# Patient Record
Sex: Female | Born: 2000 | Race: Black or African American | Hispanic: No | Marital: Single | Smoking: Never smoker
Health system: Southern US, Community
[De-identification: ages and names within clinical notes are randomized; demographics above are authoritative.]

## PROBLEM LIST (undated history)

## (undated) ENCOUNTER — Inpatient Hospital Stay (HOSPITAL_COMMUNITY): Payer: Self-pay

## (undated) ENCOUNTER — Ambulatory Visit

## (undated) ENCOUNTER — Ambulatory Visit: Payer: Self-pay | Source: Home / Self Care

## (undated) DIAGNOSIS — B999 Unspecified infectious disease: Secondary | ICD-10-CM

## (undated) DIAGNOSIS — N390 Urinary tract infection, site not specified: Secondary | ICD-10-CM

## (undated) DIAGNOSIS — Z789 Other specified health status: Secondary | ICD-10-CM

## (undated) HISTORY — PX: NO PAST SURGERIES: SHX2092

---
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
## 2018-01-04 ENCOUNTER — Encounter: Payer: Self-pay | Admitting: *Deleted

## 2018-01-04 NOTE — Telephone Encounter (Signed)
Reply via MyChart message.

## 2018-01-27 ENCOUNTER — Ambulatory Visit: Payer: Medicaid Other | Admitting: Internal Medicine

## 2018-01-28 ENCOUNTER — Encounter: Payer: Self-pay | Admitting: Internal Medicine

## 2018-01-28 ENCOUNTER — Other Ambulatory Visit (HOSPITAL_COMMUNITY)
Admission: RE | Admit: 2018-01-28 | Discharge: 2018-01-28 | Disposition: A | Discharge status: Home / Self Care | Payer: Medicaid Other | Source: Ambulatory Visit | Attending: Family Medicine | Admitting: Family Medicine

## 2018-01-28 ENCOUNTER — Ambulatory Visit (INDEPENDENT_AMBULATORY_CARE_PROVIDER_SITE_OTHER): Payer: Medicaid Other | Admitting: Internal Medicine

## 2018-01-28 VITALS — BP 124/60 | HR 98 | Temp 99.0°F | Wt 146.0 lb

## 2018-01-28 DIAGNOSIS — N926 Irregular menstruation, unspecified: Secondary | ICD-10-CM

## 2018-01-28 DIAGNOSIS — Z113 Encounter for screening for infections with a predominantly sexual mode of transmission: Secondary | ICD-10-CM | POA: Diagnosis not present

## 2018-01-28 DIAGNOSIS — Z3A01 Less than 8 weeks gestation of pregnancy: Secondary | ICD-10-CM

## 2018-01-28 DIAGNOSIS — Z3201 Encounter for pregnancy test, result positive: Secondary | ICD-10-CM | POA: Diagnosis not present

## 2018-01-28 DIAGNOSIS — Z3400 Encounter for supervision of normal first pregnancy, unspecified trimester: Secondary | ICD-10-CM | POA: Insufficient documentation

## 2018-01-28 DIAGNOSIS — Z3401 Encounter for supervision of normal first pregnancy, first trimester: Secondary | ICD-10-CM

## 2018-01-28 LAB — POCT URINE PREGNANCY: PREG TEST UR: POSITIVE — AB

## 2018-01-28 MED ORDER — PRENATAL 27-0.8 MG PO TABS: 1.0000 | ORAL_TABLET | Freq: Every day | ORAL | 0 refills | Status: DC

## 2018-01-28 NOTE — Patient Instructions (Addendum)
It was so nice to see you!  I have sent in a referral to [facility].  I have also sent in a prenatal vitamin to your pharmacy. Please take 1 vitamin daily.  We have scheduled an initial ultrasound for you.  -Dr. Nancy Marus

## 2018-01-28 NOTE — Progress Notes (Signed)
   [facility] Phone: [phone]  Subjective:  Sheri Nunez is an 13 yeah old female presenting to clinic with two positive pregnancy tests at home. Her LMP was April 6th. Her next period was supposed to be on May 6th, as she is always very regular. She had some light spotting yesterday that self-resolved. She denies any abdominal pain or cramping. She would like a referral to [facility] for prenatal care.  ROS: See HPI for pertinent positives and negatives  Past Medical History- eczema  Family history reviewed for today's visit. No changes.  Social history- patient is a never smoker  Objective: BP 124/60   Pulse 98   Temp 99 F (37.2 C) (Oral)   Wt 146 lb (66.2 kg)   LMP 12/18/2017 (Exact Date)   SpO2 97%   BMI 22.20 kg/m  Gen: NAD, alert, cooperative with exam HEENT: NCAT, EOMI, MMM Resp: Normal work of breathing Msk: No edema  Assessment/Plan: Pregnancy: Patient had two positive pregnancy tests at home. Pregnancy test positive in clinic today. Patient also requesting gonorrhea/chlamydia testing. Will add this to urine sample. Not taking any medications. Started daily prenatal vitamin. Will obtain OB US due to patient having light spotting yesterday. Denies any abdominal pain. Referred to [facility] for prenatal care per patient request.   Willadean Carol, MD PGY-3

## 2018-01-28 NOTE — Assessment & Plan Note (Addendum)
Patient had two positive pregnancy tests at home. Pregnancy test positive in clinic today. Patient also requesting gonorrhea/chlamydia testing. Will add this to urine sample. Not taking any medications. Started daily prenatal vitamin. Will obtain OB US due to patient having light spotting yesterday. Denies any abdominal pain. Referred to [facility] for prenatal care per patient request.

## 2018-01-31 LAB — URINE CYTOLOGY ANCILLARY ONLY
CHLAMYDIA, DNA PROBE: NEGATIVE
NEISSERIA GONORRHEA: NEGATIVE
Trichomonas: NEGATIVE

## 2018-02-08 ENCOUNTER — Ambulatory Visit (HOSPITAL_COMMUNITY)
Admission: RE | Admit: 2018-02-08 | Discharge: 2018-02-08 | Disposition: A | Discharge status: Home / Self Care | Payer: Medicaid Other | Source: Ambulatory Visit | Attending: Family Medicine | Admitting: Family Medicine

## 2018-02-08 DIAGNOSIS — Z3401 Encounter for supervision of normal first pregnancy, first trimester: Secondary | ICD-10-CM | POA: Insufficient documentation

## 2018-02-21 ENCOUNTER — Encounter (HOSPITAL_COMMUNITY): Payer: Self-pay | Admitting: *Deleted

## 2018-02-21 ENCOUNTER — Encounter: Payer: Medicaid Other | Admitting: Family Medicine

## 2018-02-21 ENCOUNTER — Inpatient Hospital Stay (HOSPITAL_COMMUNITY)
Admission: AD | Admit: 2018-02-21 | Discharge: 2018-02-21 | Disposition: A | Discharge status: Home / Self Care | Payer: Medicaid Other | Source: Ambulatory Visit | Attending: Obstetrics and Gynecology | Admitting: Obstetrics and Gynecology

## 2018-02-21 DIAGNOSIS — Z3A09 9 weeks gestation of pregnancy: Secondary | ICD-10-CM | POA: Diagnosis not present

## 2018-02-21 DIAGNOSIS — O209 Hemorrhage in early pregnancy, unspecified: Secondary | ICD-10-CM | POA: Diagnosis present

## 2018-02-21 LAB — WET PREP, GENITAL
Clue Cells Wet Prep HPF POC: NONE SEEN
Sperm: NONE SEEN
Trich, Wet Prep: NONE SEEN
YEAST WET PREP: NONE SEEN

## 2018-02-21 MED ORDER — DOXYLAMINE-PYRIDOXINE 10-10 MG PO TBEC: 2.0000 | DELAYED_RELEASE_TABLET | Freq: Every day | ORAL | 1 refills | Status: DC

## 2018-02-21 NOTE — MAU Note (Signed)
Pt reports blood on the tissue when she wiped this am, no pain.

## 2018-02-21 NOTE — Discharge Instructions (Signed)

## 2018-02-21 NOTE — MAU Provider Note (Signed)
History     CSN: [id number]  Arrival date and time: 02/21/18 1045   First Provider Initiated Contact with Patient 02/21/18 1130      Chief Complaint  Patient presents with  . Vaginal Bleeding   Sheri Nunez is a 17 y.o. G1P0 at 1846w2d who presents today with vaginal bleeding. She has an US with confirmed IUP on 02/08/18.  Vaginal Bleeding  The patient's primary symptoms include vaginal bleeding. The patient's pertinent negatives include no pelvic pain or vaginal discharge. This is a new problem. The current episode started today. The problem occurs intermittently. The problem has been resolved. The patient is experiencing no pain. She is pregnant. Associated symptoms include nausea and vomiting. Pertinent negatives include no chills, dysuria, fever or frequency. The vaginal discharge was bloody. The vaginal bleeding is lighter than menses. She has been passing clots (some about the size of a pea, and some about the size of a grape.). She has not been passing tissue. Exacerbated by: Last intercourse 02/18/18. She has tried nothing for the symptoms. She is sexually active.    History reviewed. No pertinent past medical history.  History reviewed. No pertinent surgical history.  History reviewed. No pertinent family history.  Social History   Tobacco Use  . Smoking status: Never Smoker  . Smokeless tobacco: Never Used  Substance Use Topics  . Alcohol use: No  . Drug use: No    Allergies: No Known Allergies  Medications Prior to Admission  Medication Sig Dispense Refill Last Dose  . hydrocortisone 2.5 % cream Apply topically 2 (two) times daily. 30 g 1 Taking  . Prenatal Vit-Fe Fumarate-FA (MULTIVITAMIN-PRENATAL) 27-0.8 MG TABS tablet Take 1 tablet by mouth daily at 12 noon. 180 each 0     Review of Systems  Constitutional: Negative for chills and fever.  Gastrointestinal: Positive for nausea and vomiting.  Genitourinary: Positive for vaginal bleeding. Negative for dysuria,  frequency, pelvic pain and vaginal discharge.   Physical Exam   Blood pressure 108/82, pulse 90, temperature 98.7 F (37.1 C), temperature source Oral, resp. rate 18, height 5\' 8"  (1.727 m), weight 145 lb (65.8 kg), last menstrual period 12/18/2017, SpO2 100 %.  Physical Exam  Nursing note and vitals reviewed. Constitutional: She is oriented to person, place, and time. She appears well-developed and well-nourished. No distress.  HENT:  Head: Normocephalic.  Cardiovascular: Normal rate.  Respiratory: Effort normal.  GI: Soft. There is no tenderness. There is no rebound.  Neurological: She is alert and oriented to person, place, and time.  Skin: Skin is warm and dry.  Psychiatric: She has a normal mood and affect.   Pt informed that the ultrasound is considered a limited OB ultrasound and is not intended to be a complete ultrasound exam.  Patient also informed that the ultrasound is not being completed with the intent of assessing for fetal or placental anomalies or any pelvic abnormalities.  Explained that the purpose of today's ultrasound is to assess for  viability.  Patient acknowledges the purpose of the exam and the limitations of the study.      MAU Course  Procedures  MDM   Assessment and Plan   1. Vaginal bleeding in pregnancy, first trimester   2. [redacted] weeks gestation of pregnancy    DC home Comfort measures reviewed  1st Trimester precautions  Bleeding precautions RX: none  Return to MAU as needed FU with OB as planned  Follow-up Information    [facility]  Follow up.   Specialty:  Family Medicine Contact information: [address] [id number] [place] [address] [phone]          Thressa Sheller 02/21/2018, 11:30 AM

## 2018-02-22 LAB — GC/CHLAMYDIA PROBE AMP (~~LOC~~) NOT AT ARMC
CHLAMYDIA, DNA PROBE: NEGATIVE
Neisseria Gonorrhea: NEGATIVE

## 2018-02-23 ENCOUNTER — Encounter (HOSPITAL_COMMUNITY): Payer: Self-pay

## 2018-02-23 ENCOUNTER — Other Ambulatory Visit: Payer: Self-pay

## 2018-02-23 ENCOUNTER — Inpatient Hospital Stay (HOSPITAL_COMMUNITY)
Admission: AD | Admit: 2018-02-23 | Discharge: 2018-02-23 | Disposition: A | Discharge status: Home / Self Care | Payer: Medicaid Other | Source: Ambulatory Visit | Attending: Obstetrics and Gynecology | Admitting: Obstetrics and Gynecology

## 2018-02-23 DIAGNOSIS — O9989 Other specified diseases and conditions complicating pregnancy, childbirth and the puerperium: Secondary | ICD-10-CM | POA: Diagnosis not present

## 2018-02-23 DIAGNOSIS — Z3A09 9 weeks gestation of pregnancy: Secondary | ICD-10-CM | POA: Diagnosis not present

## 2018-02-23 DIAGNOSIS — O98811 Other maternal infectious and parasitic diseases complicating pregnancy, first trimester: Secondary | ICD-10-CM | POA: Diagnosis not present

## 2018-02-23 DIAGNOSIS — B373 Candidiasis of vulva and vagina: Secondary | ICD-10-CM

## 2018-02-23 DIAGNOSIS — N898 Other specified noninflammatory disorders of vagina: Secondary | ICD-10-CM | POA: Diagnosis present

## 2018-02-23 DIAGNOSIS — B3731 Acute candidiasis of vulva and vagina: Secondary | ICD-10-CM

## 2018-02-23 HISTORY — DX: Other specified health status: Z78.9

## 2018-02-23 LAB — URINALYSIS, ROUTINE W REFLEX MICROSCOPIC
Bilirubin Urine: NEGATIVE
Glucose, UA: NEGATIVE mg/dL
KETONES UR: NEGATIVE mg/dL
Nitrite: NEGATIVE
PH: 6 (ref 5.0–8.0)
Protein, ur: 30 mg/dL — AB
Specific Gravity, Urine: 1.027 (ref 1.005–1.030)

## 2018-02-23 LAB — WET PREP, GENITAL
Clue Cells Wet Prep HPF POC: NONE SEEN
Sperm: NONE SEEN
TRICH WET PREP: NONE SEEN
YEAST WET PREP: NONE SEEN

## 2018-02-23 MED ORDER — TERCONAZOLE 0.8 % VA CREA: 1.0000 | TOPICAL_CREAM | Freq: Every day | VAGINAL | 0 refills | Status: DC

## 2018-02-23 NOTE — MAU Note (Signed)
Urine in the lab  

## 2018-02-23 NOTE — Discharge Instructions (Signed)

## 2018-02-23 NOTE — MAU Provider Note (Signed)
History     CSN: [id number]  Arrival date and time: 02/23/18 [id number]   First Provider Initiated Contact with Patient 02/23/18 1032      Chief Complaint  Patient presents with  . Vaginal Discharge  . burning with urination   HPI  Sheri Nunez is a 17 y.o. G1P0 at 7636w4d who presents with vaginal itching and irritation. Was seen in MAU 2 days ago for vaginal bleeding but did not have these symptoms then. Reports change in discharge yesterday and irritation today. Describes thick white discharge. No foul odor. Feels irritated in vaginal area. Also reports burning when she voids. Feels like burning comes from her urethra and when her urine touches her vulva. Denies vaginal bleeding or hematuria. No intercourse since last visit to MAU. GC/CT was negative at that visit.   OB History    Gravida  1   Para      Term      Preterm      AB      Living        SAB      TAB      Ectopic      Multiple      Live Births              Past Medical History:  Diagnosis Date  . Medical history non-contributory     Past Surgical History:  Procedure Laterality Date  . NO PAST SURGERIES      History reviewed. No pertinent family history.  Social History   Tobacco Use  . Smoking status: Never Smoker  . Smokeless tobacco: Never Used  Substance Use Topics  . Alcohol use: No  . Drug use: No    Allergies: No Known Allergies  Medications Prior to Admission  Medication Sig Dispense Refill Last Dose  . hydrocortisone cream 1 % Apply 1 application topically daily as needed (For breakouts.).   Past Month at Unknown time  . Prenatal Vit-Fe Fumarate-FA (MULTIVITAMIN-PRENATAL) 27-0.8 MG TABS tablet Take 1 tablet by mouth daily at 12 noon. 180 each 0 02/22/2018 at Unknown time  . Doxylamine-Pyridoxine 10-10 MG TBEC Take 2 tablets by mouth at bedtime. Take 2 tabs at bedtime, may use 1 tab in am, and 1 tab in afternoon (Patient taking differently: Take 1-2 tablets by mouth 3 (three)  times daily as needed (For nausea.). Take 2 tabs at bedtime, may use 1 tab in am, and 1 tab in afternoon) 100 tablet 1 Has not started    Review of Systems  Constitutional: Negative.   Gastrointestinal: Negative.   Genitourinary: Positive for dysuria and vaginal discharge. Negative for frequency, genital sores, hematuria and vaginal bleeding.   Physical Exam   Blood pressure 129/76, pulse 91, temperature 98.7 F (37.1 C), temperature source Oral, resp. rate 16, weight 146 lb 4 oz (66.3 kg), last menstrual period 12/18/2017, SpO2 100 %.  Physical Exam  Nursing note and vitals reviewed. Constitutional: She is oriented to person, place, and time. She appears well-developed and well-nourished. No distress.  HENT:  Head: Normocephalic and atraumatic.  Eyes: Conjunctivae are normal. Right eye exhibits no discharge. Left eye exhibits no discharge. No scleral icterus.  Neck: Normal range of motion.  Respiratory: Effort normal. No respiratory distress.  Genitourinary: There is no lesion on the right labia. There is no lesion on the left labia. Cervix exhibits no discharge and no friability. No erythema or bleeding in the vagina. Vaginal discharge (minimal amount of thick white discharge adherent  to vaginal walls) found.  Neurological: She is alert and oriented to person, place, and time.  Skin: Skin is warm and dry. She is not diaphoretic.  Psychiatric: She has a normal mood and affect. Her behavior is normal. Judgment and thought content normal.    MAU Course  Procedures Results for orders placed or performed during the hospital encounter of 02/23/18 (from the past 24 hour(s))  Urinalysis, Routine w reflex microscopic     Status: Abnormal   Collection Time: 02/23/18  9:38 AM  Result Value Ref Range   Color, Urine YELLOW YELLOW   APPearance CLOUDY (A) CLEAR   Specific Gravity, Urine 1.027 1.005 - 1.030   pH 6.0 5.0 - 8.0   Glucose, UA NEGATIVE NEGATIVE mg/dL   Hgb urine dipstick SMALL  (A) NEGATIVE   Bilirubin Urine NEGATIVE NEGATIVE   Ketones, ur NEGATIVE NEGATIVE mg/dL   Protein, ur 30 (A) NEGATIVE mg/dL   Nitrite NEGATIVE NEGATIVE   Leukocytes, UA TRACE (A) NEGATIVE   RBC / HPF 0-5 0 - 5 RBC/hpf   WBC, UA 6-10 0 - 5 WBC/hpf   Bacteria, UA RARE (A) NONE SEEN   Squamous Epithelial / LPF 21-50 0 - 5   Mucus PRESENT   Wet prep, genital     Status: Abnormal   Collection Time: 02/23/18 10:46 AM  Result Value Ref Range   Yeast Wet Prep HPF POC NONE SEEN NONE SEEN   Trich, Wet Prep NONE SEEN NONE SEEN   Clue Cells Wet Prep HPF POC NONE SEEN NONE SEEN   WBC, Wet Prep HPF POC FEW (A) NONE SEEN   Sperm NONE SEEN     MDM U/a & wet prep collected U/a with some leuks and hemoglobin --- urine culture sent Wet prep negative --- will tx for yeast based on exam and complaint GC/CT not collected, negatiev 2 days ago Assessment and Plan  A:  1. Vaginal yeast infection    P: Discharge home Rx terazol 3 Urine culture pending Discussed reasons to return to MAU  Judeth Horn 02/23/2018, 10:32 AM

## 2018-02-23 NOTE — MAU Note (Signed)
Was here a couple days ago for bleeding.  Noted a white d/c yesterday.  Today she is having burning when she urinates.

## 2018-02-24 LAB — CULTURE, OB URINE: Culture: 60000 — AB

## 2018-02-25 ENCOUNTER — Other Ambulatory Visit: Payer: Self-pay | Admitting: Obstetrics and Gynecology

## 2018-02-25 DIAGNOSIS — O2341 Unspecified infection of urinary tract in pregnancy, first trimester: Secondary | ICD-10-CM

## 2018-02-25 MED ORDER — CEPHALEXIN 500 MG PO CAPS: 500.0000 mg | ORAL_CAPSULE | Freq: Four times a day (QID) | ORAL | 0 refills | Status: DC

## 2018-02-25 NOTE — Progress Notes (Signed)
TC to patient to notify of (+) UCx results and need to get Rx for UTI sent to pharmacy. Patient verbalized an understanding of the plan of care and agrees.   Raelyn MoraRolitta Waverly Tarquinio, CNM  02/25/2018 11:16 AM

## 2018-03-02 ENCOUNTER — Inpatient Hospital Stay (HOSPITAL_COMMUNITY)
Admission: AD | Admit: 2018-03-02 | Discharge: 2018-03-02 | Discharge status: Against Medical Advice | Payer: Medicaid Other | Source: Ambulatory Visit | Attending: Obstetrics & Gynecology | Admitting: Obstetrics & Gynecology

## 2018-03-02 ENCOUNTER — Other Ambulatory Visit: Payer: Self-pay

## 2018-03-02 DIAGNOSIS — Z5321 Procedure and treatment not carried out due to patient leaving prior to being seen by health care provider: Secondary | ICD-10-CM | POA: Diagnosis not present

## 2018-03-02 LAB — WET PREP, GENITAL
Clue Cells Wet Prep HPF POC: NONE SEEN
Sperm: NONE SEEN
Trich, Wet Prep: NONE SEEN
Yeast Wet Prep HPF POC: NONE SEEN

## 2018-03-02 NOTE — MAU Note (Signed)
Pt not in lobby.  

## 2018-03-02 NOTE — MAU Note (Signed)
Pt not in lobby x3. Pt left AMA.

## 2018-03-02 NOTE — MAU Note (Signed)
Was treated last week for yeast,  Still having thick d/c, vaginal irritation and one of her lips is swollen.

## 2018-03-02 NOTE — MAU Note (Signed)
Pt not in lobby x2 

## 2018-03-03 LAB — GC/CHLAMYDIA PROBE AMP (~~LOC~~) NOT AT ARMC
Chlamydia: NEGATIVE
Neisseria Gonorrhea: NEGATIVE

## 2018-03-08 ENCOUNTER — Other Ambulatory Visit (HOSPITAL_COMMUNITY)
Admission: RE | Admit: 2018-03-08 | Discharge: 2018-03-08 | Disposition: A | Discharge status: Home / Self Care | Payer: Medicaid Other | Source: Ambulatory Visit | Attending: Family Medicine | Admitting: Family Medicine

## 2018-03-08 ENCOUNTER — Encounter: Payer: Self-pay | Admitting: Family Medicine

## 2018-03-08 ENCOUNTER — Ambulatory Visit (INDEPENDENT_AMBULATORY_CARE_PROVIDER_SITE_OTHER): Payer: Medicaid Other | Admitting: Family Medicine

## 2018-03-08 ENCOUNTER — Other Ambulatory Visit: Payer: Self-pay

## 2018-03-08 VITALS — BP 100/70 | HR 95 | Temp 98.6°F | Wt 142.0 lb

## 2018-03-08 DIAGNOSIS — Z3401 Encounter for supervision of normal first pregnancy, first trimester: Secondary | ICD-10-CM | POA: Diagnosis present

## 2018-03-08 LAB — POCT WET PREP (WET MOUNT)
CLUE CELLS WET PREP WHIFF POC: NEGATIVE
TRICHOMONAS WET PREP HPF POC: ABSENT

## 2018-03-08 NOTE — Patient Instructions (Addendum)
It was great seeing you today! We'll see you back in 4 weeks for your next visit. If you develop any bleeding or pain before then please go to the MAU at [facility] for evaluation. If you have questions or concerns please do not hesitate to call at [phone].  Dolores Patty, DO PGY-2, [facility] 03/08/2018 9:57 AM   Second Trimester of Pregnancy The second trimester is from week 14 through week 27 (months 4 through 6). The second trimester is often a time when you feel your best. Your body has adjusted to being pregnant, and you begin to feel better physically. Usually, morning sickness has lessened or quit completely, you may have more energy, and you may have an increase in appetite. The second trimester is also a time when the fetus is growing rapidly. At the end of the sixth month, the fetus is about 9 inches long and weighs about 1 pounds. You will likely begin to feel the baby move (quickening) between 16 and 20 weeks of pregnancy. Body changes during your second trimester Your body continues to go through many changes during your second trimester. The changes vary from woman to woman.  Your weight will continue to increase. You will notice your lower abdomen bulging out.  You may begin to get stretch marks on your hips, abdomen, and breasts.  You may develop headaches that can be relieved by medicines. The medicines should be approved by your health care provider.  You may urinate more often because the fetus is pressing on your bladder.  You may develop or continue to have heartburn as a result of your pregnancy.  You may develop constipation because certain hormones are causing the muscles that push waste through your intestines to slow down.  You may develop hemorrhoids or swollen, bulging veins (varicose veins).  You may have back pain. This is caused by: ? Weight gain. ? Pregnancy hormones that are relaxing the joints in your pelvis. ? A shift  in weight and the muscles that support your balance.  Your breasts will continue to grow and they will continue to become tender.  Your gums may bleed and may be sensitive to brushing and flossing.  Dark spots or blotches (chloasma, mask of pregnancy) may develop on your face. This will likely fade after the baby is born.  A dark line from your belly button to the pubic area (linea nigra) may appear. This will likely fade after the baby is born.  You may have changes in your hair. These can include thickening of your hair, rapid growth, and changes in texture. Some women also have hair loss during or after pregnancy, or hair that feels dry or thin. Your hair will most likely return to normal after your baby is born.  What to expect at prenatal visits During a routine prenatal visit:  You will be weighed to make sure you and the fetus are growing normally.  Your blood pressure will be taken.  Your abdomen will be measured to track your baby's growth.  The fetal heartbeat will be listened to.  Any test results from the previous visit will be discussed.  Your health care provider may ask you:  How you are feeling.  If you are feeling the baby move.  If you have had any abnormal symptoms, such as leaking fluid, bleeding, severe headaches, or abdominal cramping.  If you are using any tobacco products, including cigarettes, chewing tobacco, and electronic cigarettes.  If you have  any questions.  Other tests that may be performed during your second trimester include:  Blood tests that check for: ? Low iron levels (anemia). ? High blood sugar that affects pregnant women (gestational diabetes) between 57 and 28 weeks. ? Rh antibodies. This is to check for a protein on red blood cells (Rh factor).  Urine tests to check for infections, diabetes, or protein in the urine.  An ultrasound to confirm the proper growth and development of the baby.  An amniocentesis to check for  possible genetic problems.  Fetal screens for spina bifida and Down syndrome.  HIV (human immunodeficiency virus) testing. Routine prenatal testing includes screening for HIV, unless you choose not to have this test.  Follow these instructions at home: Medicines  Follow your health care provider's instructions regarding medicine use. Specific medicines may be either safe or unsafe to take during pregnancy.  Take a prenatal vitamin that contains at least 600 micrograms (mcg) of folic acid.  If you develop constipation, try taking a stool softener if your health care provider approves. Eating and drinking  Eat a balanced diet that includes fresh fruits and vegetables, whole grains, good sources of protein such as meat, eggs, or tofu, and low-fat dairy. Your health care provider will help you determine the amount of weight gain that is right for you.  Avoid raw meat and uncooked cheese. These carry germs that can cause birth defects in the baby.  If you have low calcium intake from food, talk to your health care provider about whether you should take a daily calcium supplement.  Limit foods that are high in fat and processed sugars, such as fried and sweet foods.  To prevent constipation: ? Drink enough fluid to keep your urine clear or pale yellow. ? Eat foods that are high in fiber, such as fresh fruits and vegetables, whole grains, and beans. Activity  Exercise only as directed by your health care provider. Most women can continue their usual exercise routine during pregnancy. Try to exercise for 30 minutes at least 5 days a week. Stop exercising if you experience uterine contractions.  Avoid heavy lifting, wear low heel shoes, and practice good posture.  A sexual relationship may be continued unless your health care provider directs you otherwise. Relieving pain and discomfort  Wear a good support bra to prevent discomfort from breast tenderness.  Take warm sitz baths to soothe  any pain or discomfort caused by hemorrhoids. Use hemorrhoid cream if your health care provider approves.  Rest with your legs elevated if you have leg cramps or low back pain.  If you develop varicose veins, wear support hose. Elevate your feet for 15 minutes, 3-4 times a day. Limit salt in your diet. Prenatal Care  Write down your questions. Take them to your prenatal visits.  Keep all your prenatal visits as told by your health care provider. This is important. Safety  Wear your seat belt at all times when driving.  Make a list of emergency phone numbers, including numbers for family, friends, the hospital, and police and fire departments. General instructions  Ask your health care provider for a referral to a local prenatal education class. Begin classes no later than the beginning of month 6 of your pregnancy.  Ask for help if you have counseling or nutritional needs during pregnancy. Your health care provider can offer advice or refer you to specialists for help with various needs.  Do not use hot tubs, steam rooms, or saunas.  Do  not douche or use tampons or scented sanitary pads.  Do not cross your legs for long periods of time.  Avoid cat litter boxes and soil used by cats. These carry germs that can cause birth defects in the baby and possibly loss of the fetus by miscarriage or stillbirth.  Avoid all smoking, herbs, alcohol, and unprescribed drugs. Chemicals in these products can affect the formation and growth of the baby.  Do not use any products that contain nicotine or tobacco, such as cigarettes and e-cigarettes. If you need help quitting, ask your health care provider.  Visit your dentist if you have not gone yet during your pregnancy. Use a soft toothbrush to brush your teeth and be gentle when you floss. Contact a health care provider if:  You have dizziness.  You have mild pelvic cramps, pelvic pressure, or nagging pain in the abdominal area.  You have  persistent nausea, vomiting, or diarrhea.  You have a bad smelling vaginal discharge.  You have pain when you urinate. Get help right away if:  You have a fever.  You are leaking fluid from your vagina.  You have spotting or bleeding from your vagina.  You have severe abdominal cramping or pain.  You have rapid weight gain or weight loss.  You have shortness of breath with chest pain.  You notice sudden or extreme swelling of your face, hands, ankles, feet, or legs.  You have not felt your baby move in over an hour.  You have severe headaches that do not go away when you take medicine.  You have vision changes. Summary  The second trimester is from week 14 through week 27 (months 4 through 6). It is also a time when the fetus is growing rapidly.  Your body goes through many changes during pregnancy. The changes vary from woman to woman.  Avoid all smoking, herbs, alcohol, and unprescribed drugs. These chemicals affect the formation and growth your baby.  Do not use any tobacco products, such as cigarettes, chewing tobacco, and e-cigarettes. If you need help quitting, ask your health care provider.  Contact your health care provider if you have any questions. Keep all prenatal visits as told by your health care provider. This is important. This information is not intended to replace advice given to you by your health care provider. Make sure you discuss any questions you have with your health care provider. Document Released: 08/25/2001 Document Revised: 10/06/2016 Document Reviewed: 10/06/2016 Elsevier Interactive Patient Education  [employer]2018 [employer]

## 2018-03-08 NOTE — Progress Notes (Signed)
  Sheri Nunez is a 17 y.o. yo G1P0 at 345w3d who presents for her initial prenatal visit. Pregnancy is not planned She reports fatigue. She  is taking PNV. See flow sheet for details.  PMH, POBH, FH, meds, allergies and Social Hx reviewed.  Prenatal Exam: Gen: Well nourished, well developed.  No distress.  Vitals noted. HEENT: Normocephalic, atraumatic.  Neck supple without cervical lymphadenopathy, thyromegaly or thyroid nodules.  Fair dentition. CV: RRR no murmur, gallops or rubs Lungs: CTAB.  Normal respiratory effort without wheezes or rales. Abd: soft, NTND. +BS.  Uterus not appreciated above pelvis. GU: Normal external female genitalia without lesions.  Normal vaginal, well rugated without lesions. No vaginal discharge.  Bimanual exam: No adnexal mass or TTP. No CMT.  Uterus size appropriate for GA Ext: No clubbing, cyanosis or edema. Psych: Normal grooming and dress.  Not depressed or anxious appearing.  Normal thought content and process without flight of ideas or looseness of associations.  Assessment & Plan: 1) 17 y.o. yo G1P0 at 695w3d via LMP doing well.  Current pregnancy issues include fatigue. Dating is reliable. Prenatal labs not obtained yet, will get today Genetic screening offered: interested in quad screen Early glucola is not indicated.  PHQ-9 and Pregnancy Medical Home forms completed and reviewed.  Bleeding and pain precautions reviewed. Importance of prenatal vitamins reviewed.  Follow up in 4 weeks.  Dolores PattyAngela Nevia Henkin, DO PGY-2, [facility] 03/08/2018 9:01 AM

## 2018-03-09 LAB — OBSTETRIC PANEL, INCLUDING HIV
Antibody Screen: NEGATIVE
BASOS: 0 %
Basophils Absolute: 0 10*3/uL (ref 0.0–0.2)
EOS (ABSOLUTE): 0.1 10*3/uL (ref 0.0–0.4)
EOS: 3 %
HEMOGLOBIN: 12.5 g/dL (ref 11.1–15.9)
HEP B S AG: NEGATIVE
HIV Screen 4th Generation wRfx: NONREACTIVE
Hematocrit: 36 % (ref 34.0–46.6)
IMMATURE GRANS (ABS): 0 10*3/uL (ref 0.0–0.1)
Immature Granulocytes: 0 %
LYMPHS: 29 %
Lymphocytes Absolute: 1.5 10*3/uL (ref 0.7–3.1)
MCH: 30.7 pg (ref 26.6–33.0)
MCHC: 34.7 g/dL (ref 31.5–35.7)
MCV: 89 fL (ref 79–97)
MONOS ABS: 0.3 10*3/uL (ref 0.1–0.9)
Monocytes: 7 %
Neutrophils Absolute: 3.2 10*3/uL (ref 1.4–7.0)
Neutrophils: 61 %
Platelets: 353 10*3/uL (ref 150–450)
RBC: 4.07 x10E6/uL (ref 3.77–5.28)
RDW: 13.9 % (ref 12.3–15.4)
RH TYPE: POSITIVE
RPR: NONREACTIVE
Rubella Antibodies, IGG: 8.4 index (ref >0.99)
WBC: 5.2 10*3/uL (ref 3.4–10.8)

## 2018-03-09 LAB — CERVICOVAGINAL ANCILLARY ONLY
CHLAMYDIA, DNA PROBE: NEGATIVE
Neisseria Gonorrhea: NEGATIVE

## 2018-03-10 ENCOUNTER — Telehealth: Payer: Self-pay | Admitting: *Deleted

## 2018-03-10 NOTE — Telephone Encounter (Signed)
Patient informed of negative results. Jazmin Hartsell,CMA  

## 2018-03-10 NOTE — Telephone Encounter (Signed)
-----   Message from Tillman SersAngela C Riccio, DO sent at 03/10/2018  8:49 AM EDT ----- Regarding: neg std testing  [facility] can we let her know these were negative, thanks!

## 2018-03-22 ENCOUNTER — Ambulatory Visit (INDEPENDENT_AMBULATORY_CARE_PROVIDER_SITE_OTHER): Payer: Medicaid Other

## 2018-03-22 ENCOUNTER — Other Ambulatory Visit (HOSPITAL_COMMUNITY)
Admission: RE | Admit: 2018-03-22 | Discharge: 2018-03-22 | Disposition: A | Discharge status: Home / Self Care | Payer: Medicaid Other | Source: Ambulatory Visit | Attending: Advanced Practice Midwife | Admitting: Advanced Practice Midwife

## 2018-03-22 DIAGNOSIS — N390 Urinary tract infection, site not specified: Secondary | ICD-10-CM

## 2018-03-22 DIAGNOSIS — Z202 Contact with and (suspected) exposure to infections with a predominantly sexual mode of transmission: Secondary | ICD-10-CM | POA: Diagnosis not present

## 2018-03-22 LAB — POCT URINALYSIS DIP (DEVICE)
Bilirubin Urine: NEGATIVE
GLUCOSE, UA: NEGATIVE mg/dL
Hgb urine dipstick: NEGATIVE
KETONES UR: 15 mg/dL — AB
Nitrite: NEGATIVE
Protein, ur: 100 mg/dL — AB
Specific Gravity, Urine: 1.025 (ref 1.005–1.030)
Urobilinogen, UA: 1 mg/dL (ref 0.0–1.0)
pH: 6.5 (ref 5.0–8.0)

## 2018-03-22 MED ORDER — PHENAZOPYRIDINE HCL 200 MG PO TABS: 200.0000 mg | ORAL_TABLET | Freq: Three times a day (TID) | ORAL | 0 refills | Status: DC | PRN

## 2018-03-22 NOTE — Progress Notes (Signed)
Pt came for Nurse visit due to Burning when urinating & also having d/c.Processed UA,STD Swab per provider until her Urine Culture comes back to prescribe Pyridium 200 mg 3x PRN for burning & discomfort. She can also use Tylenol and/or Ibuprofen prn for pain also. Pt has access to My Chart, advised she will see results within a couple of days there. Pt verbalized understanding

## 2018-03-24 LAB — CERVICOVAGINAL ANCILLARY ONLY
Bacterial vaginitis: NEGATIVE
CANDIDA VAGINITIS: POSITIVE — AB
Chlamydia: NEGATIVE
NEISSERIA GONORRHEA: NEGATIVE
TRICH (WINDOWPATH): NEGATIVE

## 2018-03-24 LAB — URINE CULTURE

## 2018-03-25 ENCOUNTER — Telehealth: Payer: Self-pay | Admitting: General Practice

## 2018-03-25 DIAGNOSIS — B379 Candidiasis, unspecified: Secondary | ICD-10-CM

## 2018-03-25 MED ORDER — TERCONAZOLE 0.4 % VA CREA: 1.0000 | TOPICAL_CREAM | Freq: Every day | VAGINAL | 0 refills | Status: DC

## 2018-03-25 NOTE — Telephone Encounter (Signed)
Patient called and left message on nurse voicemail line requesting results as they are not in mychart. Called patient, no answer- left message stating we are trying to reach you with results. Your test shows you have a yeast infection & a prescription has been sent to your pharmacy. You may call us back if you have questions.

## 2018-04-05 ENCOUNTER — Ambulatory Visit (INDEPENDENT_AMBULATORY_CARE_PROVIDER_SITE_OTHER): Payer: Medicaid Other | Admitting: Family Medicine

## 2018-04-05 ENCOUNTER — Other Ambulatory Visit: Payer: Self-pay

## 2018-04-05 VITALS — BP 108/60 | HR 99 | Temp 98.3°F | Wt 145.8 lb

## 2018-04-05 DIAGNOSIS — Z3482 Encounter for supervision of other normal pregnancy, second trimester: Secondary | ICD-10-CM

## 2018-04-05 DIAGNOSIS — Z3A15 15 weeks gestation of pregnancy: Secondary | ICD-10-CM

## 2018-04-05 NOTE — Patient Instructions (Signed)
Great to see you today! We scheduled your OB ultrasound and your next visit is 8/22 at 11:30 am here at the family medicine center.  If you have questions or concerns please do not hesitate to call at [phone].  Dolores PattyAngela Khadir Roam, DO PGY-2, [facility] 04/05/2018 2:00 PM   Second Trimester of Pregnancy The second trimester is from week 14 through week 27 (months 4 through 6). The second trimester is often a time when you feel your best. Your body has adjusted to being pregnant, and you begin to feel better physically. Usually, morning sickness has lessened or quit completely, you may have more energy, and you may have an increase in appetite. The second trimester is also a time when the fetus is growing rapidly. At the end of the sixth month, the fetus is about 9 inches long and weighs about 1 pounds. You will likely begin to feel the baby move (quickening) between 16 and 20 weeks of pregnancy. Body changes during your second trimester Your body continues to go through many changes during your second trimester. The changes vary from woman to woman.  Your weight will continue to increase. You will notice your lower abdomen bulging out.  You may begin to get stretch marks on your hips, abdomen, and breasts.  You may develop headaches that can be relieved by medicines. The medicines should be approved by your health care provider.  You may urinate more often because the fetus is pressing on your bladder.  You may develop or continue to have heartburn as a result of your pregnancy.  You may develop constipation because certain hormones are causing the muscles that push waste through your intestines to slow down.  You may develop hemorrhoids or swollen, bulging veins (varicose veins).  You may have back pain. This is caused by: ? Weight gain. ? Pregnancy hormones that are relaxing the joints in your pelvis. ? A shift in weight and the muscles that support your  balance.  Your breasts will continue to grow and they will continue to become tender.  Your gums may bleed and may be sensitive to brushing and flossing.  Dark spots or blotches (chloasma, mask of pregnancy) may develop on your face. This will likely fade after the baby is born.  A dark line from your belly button to the pubic area (linea nigra) may appear. This will likely fade after the baby is born.  You may have changes in your hair. These can include thickening of your hair, rapid growth, and changes in texture. Some women also have hair loss during or after pregnancy, or hair that feels dry or thin. Your hair will most likely return to normal after your baby is born.  What to expect at prenatal visits During a routine prenatal visit:  You will be weighed to make sure you and the fetus are growing normally.  Your blood pressure will be taken.  Your abdomen will be measured to track your baby's growth.  The fetal heartbeat will be listened to.  Any test results from the previous visit will be discussed.  Your health care provider may ask you:  How you are feeling.  If you are feeling the baby move.  If you have had any abnormal symptoms, such as leaking fluid, bleeding, severe headaches, or abdominal cramping.  If you are using any tobacco products, including cigarettes, chewing tobacco, and electronic cigarettes.  If you have any questions.  Other tests that may be performed during your  second trimester include:  Blood tests that check for: ? Low iron levels (anemia). ? High blood sugar that affects pregnant women (gestational diabetes) between 93 and 28 weeks. ? Rh antibodies. This is to check for a protein on red blood cells (Rh factor).  Urine tests to check for infections, diabetes, or protein in the urine.  An ultrasound to confirm the proper growth and development of the baby.  An amniocentesis to check for possible genetic problems.  Fetal screens for  spina bifida and Down syndrome.  HIV (human immunodeficiency virus) testing. Routine prenatal testing includes screening for HIV, unless you choose not to have this test.  Follow these instructions at home: Medicines  Follow your health care provider's instructions regarding medicine use. Specific medicines may be either safe or unsafe to take during pregnancy.  Take a prenatal vitamin that contains at least 600 micrograms (mcg) of folic acid.  If you develop constipation, try taking a stool softener if your health care provider approves. Eating and drinking  Eat a balanced diet that includes fresh fruits and vegetables, whole grains, good sources of protein such as meat, eggs, or tofu, and low-fat dairy. Your health care provider will help you determine the amount of weight gain that is right for you.  Avoid raw meat and uncooked cheese. These carry germs that can cause birth defects in the baby.  If you have low calcium intake from food, talk to your health care provider about whether you should take a daily calcium supplement.  Limit foods that are high in fat and processed sugars, such as fried and sweet foods.  To prevent constipation: ? Drink enough fluid to keep your urine clear or pale yellow. ? Eat foods that are high in fiber, such as fresh fruits and vegetables, whole grains, and beans. Activity  Exercise only as directed by your health care provider. Most women can continue their usual exercise routine during pregnancy. Try to exercise for 30 minutes at least 5 days a week. Stop exercising if you experience uterine contractions.  Avoid heavy lifting, wear low heel shoes, and practice good posture.  A sexual relationship may be continued unless your health care provider directs you otherwise. Relieving pain and discomfort  Wear a good support bra to prevent discomfort from breast tenderness.  Take warm sitz baths to soothe any pain or discomfort caused by hemorrhoids.  Use hemorrhoid cream if your health care provider approves.  Rest with your legs elevated if you have leg cramps or low back pain.  If you develop varicose veins, wear support hose. Elevate your feet for 15 minutes, 3-4 times a day. Limit salt in your diet. Prenatal Care  Write down your questions. Take them to your prenatal visits.  Keep all your prenatal visits as told by your health care provider. This is important. Safety  Wear your seat belt at all times when driving.  Make a list of emergency phone numbers, including numbers for family, friends, the hospital, and police and fire departments. General instructions  Ask your health care provider for a referral to a local prenatal education class. Begin classes no later than the beginning of month 6 of your pregnancy.  Ask for help if you have counseling or nutritional needs during pregnancy. Your health care provider can offer advice or refer you to specialists for help with various needs.  Do not use hot tubs, steam rooms, or saunas.  Do not douche or use tampons or scented sanitary pads.  Do  not cross your legs for long periods of time.  Avoid cat litter boxes and soil used by cats. These carry germs that can cause birth defects in the baby and possibly loss of the fetus by miscarriage or stillbirth.  Avoid all smoking, herbs, alcohol, and unprescribed drugs. Chemicals in these products can affect the formation and growth of the baby.  Do not use any products that contain nicotine or tobacco, such as cigarettes and e-cigarettes. If you need help quitting, ask your health care provider.  Visit your dentist if you have not gone yet during your pregnancy. Use a soft toothbrush to brush your teeth and be gentle when you floss. Contact a health care provider if:  You have dizziness.  You have mild pelvic cramps, pelvic pressure, or nagging pain in the abdominal area.  You have persistent nausea, vomiting, or diarrhea.  You  have a bad smelling vaginal discharge.  You have pain when you urinate. Get help right away if:  You have a fever.  You are leaking fluid from your vagina.  You have spotting or bleeding from your vagina.  You have severe abdominal cramping or pain.  You have rapid weight gain or weight loss.  You have shortness of breath with chest pain.  You notice sudden or extreme swelling of your face, hands, ankles, feet, or legs.  You have not felt your baby move in over an hour.  You have severe headaches that do not go away when you take medicine.  You have vision changes. Summary  The second trimester is from week 14 through week 27 (months 4 through 6). It is also a time when the fetus is growing rapidly.  Your body goes through many changes during pregnancy. The changes vary from woman to woman.  Avoid all smoking, herbs, alcohol, and unprescribed drugs. These chemicals affect the formation and growth your baby.  Do not use any tobacco products, such as cigarettes, chewing tobacco, and e-cigarettes. If you need help quitting, ask your health care provider.  Contact your health care provider if you have any questions. Keep all prenatal visits as told by your health care provider. This is important. This information is not intended to replace advice given to you by your health care provider. Make sure you discuss any questions you have with your health care provider. Document Released: 08/25/2001 Document Revised: 10/06/2016 Document Reviewed: 10/06/2016 Elsevier Interactive Patient Education  [employer].

## 2018-04-05 NOTE — Progress Notes (Signed)
  Sheri Nunez is a 17 y.o. G1P0 at 4413w3d here for routine follow up.  She reports headache, no bleeding, no contractions, no cramping, no leaking and vaginal irritation. See flow sheet for details.  A/P: Pregnancy at 5413w3d.  Doing well.   Pregnancy issues include none. Anatomy ultrasound ordered to be scheduled at 18-19 weeks. Patient is interested in genetic screening. Will get quad screen. Bleeding and pain precautions reviewed. Follow up 4 weeks in OB clinic.   Dolores PattyAngela Raylynne Cubbage, DO PGY-2, [facility] 04/05/2018 11:53 AM

## 2018-04-10 ENCOUNTER — Other Ambulatory Visit: Payer: Self-pay | Admitting: Family Medicine

## 2018-04-10 DIAGNOSIS — Z3492 Encounter for supervision of normal pregnancy, unspecified, second trimester: Secondary | ICD-10-CM

## 2018-04-10 NOTE — Progress Notes (Signed)
Quad screen ordered for clinic collect at [redacted] weeks GA

## 2018-04-12 ENCOUNTER — Other Ambulatory Visit: Payer: Medicaid Other

## 2018-04-19 ENCOUNTER — Encounter (HOSPITAL_COMMUNITY): Payer: Self-pay

## 2018-04-26 ENCOUNTER — Other Ambulatory Visit (HOSPITAL_COMMUNITY): Payer: Self-pay | Admitting: *Deleted

## 2018-04-26 ENCOUNTER — Ambulatory Visit (HOSPITAL_COMMUNITY)
Admission: RE | Admit: 2018-04-26 | Discharge: 2018-04-26 | Disposition: A | Discharge status: Home / Self Care | Payer: Medicaid Other | Source: Ambulatory Visit | Attending: Family Medicine | Admitting: Family Medicine

## 2018-04-26 DIAGNOSIS — Z3A18 18 weeks gestation of pregnancy: Secondary | ICD-10-CM | POA: Diagnosis not present

## 2018-04-26 DIAGNOSIS — Z3A15 15 weeks gestation of pregnancy: Secondary | ICD-10-CM

## 2018-04-26 DIAGNOSIS — Z363 Encounter for antenatal screening for malformations: Secondary | ICD-10-CM | POA: Insufficient documentation

## 2018-04-26 DIAGNOSIS — Z362 Encounter for other antenatal screening follow-up: Secondary | ICD-10-CM

## 2018-04-27 ENCOUNTER — Inpatient Hospital Stay (HOSPITAL_COMMUNITY)
Admission: AD | Admit: 2018-04-27 | Discharge: 2018-04-27 | Disposition: A | Discharge status: Home / Self Care | Payer: Medicaid Other | Source: Ambulatory Visit | Attending: Obstetrics and Gynecology | Admitting: Obstetrics and Gynecology

## 2018-04-27 ENCOUNTER — Encounter (HOSPITAL_COMMUNITY): Payer: Self-pay

## 2018-04-27 ENCOUNTER — Other Ambulatory Visit: Payer: Self-pay

## 2018-04-27 DIAGNOSIS — Z3A18 18 weeks gestation of pregnancy: Secondary | ICD-10-CM | POA: Diagnosis not present

## 2018-04-27 DIAGNOSIS — O26892 Other specified pregnancy related conditions, second trimester: Secondary | ICD-10-CM | POA: Diagnosis not present

## 2018-04-27 DIAGNOSIS — R51 Headache: Secondary | ICD-10-CM | POA: Diagnosis present

## 2018-04-27 DIAGNOSIS — G44229 Chronic tension-type headache, not intractable: Secondary | ICD-10-CM | POA: Diagnosis not present

## 2018-04-27 LAB — URINALYSIS, ROUTINE W REFLEX MICROSCOPIC
Bilirubin Urine: NEGATIVE
Glucose, UA: NEGATIVE mg/dL
Hgb urine dipstick: NEGATIVE
Ketones, ur: NEGATIVE mg/dL
LEUKOCYTES UA: NEGATIVE
Nitrite: NEGATIVE
Protein, ur: NEGATIVE mg/dL
SPECIFIC GRAVITY, URINE: 1.009 (ref 1.005–1.030)
pH: 7 (ref 5.0–8.0)

## 2018-04-27 MED ORDER — ACETAMINOPHEN 500 MG PO TABS
1000.0000 mg | ORAL_TABLET | Freq: Once | ORAL | Status: AC
  Administered 2018-04-27: 1000 mg via ORAL
  Filled 2018-04-27: qty 2

## 2018-04-27 NOTE — Discharge Instructions (Signed)

## 2018-04-27 NOTE — MAU Note (Signed)
Basically had a really bad headache, all day.  Took some Tylenol, did not help.  Denies hx of migraine.

## 2018-04-27 NOTE — MAU Provider Note (Signed)
History     CSN: [id number]  Arrival date and time: 04/27/18 1546   First Provider Initiated Contact with Patient 04/27/18 1614      Chief Complaint  Patient presents with  . Headache   Sheri Nunez is a 17 y.o. G1P0 at 6231w4d presenting with headache.  She has a mild, throbbing, frontal non-radiating headache now. It is similar in quality to the frequent (about 2/week) H/A's she's had throughout the pregnancy. She rarely had H/A prior to pregnancy. Onset was yesterday evening, but she did not treat it then and had no trouble sleeping. It was worse on awakening and she took 2 ES Tylenol at about 8am with partial relief. She again took 2 ES Tylenol at 1pm with some pain abatement but is concerned the H/A did not completely resolve as it usually does when she takes Tylenol.  No aggravating or alleviating factors. She's also concerned because a friend told her the H/A may be related to her blood pressure. Denies any photophobia, nausea or neurologic sx.  She reports urinary frequency and wants to be checked for UTI.  No abdominal pain or contractions. Has had quickening. Lives alternately with mother or grandmother and has family and FOB support. Starts college nest week. [facility].       Past Medical History:  Diagnosis Date  . Medical history non-contributory     Past Surgical History:  Procedure Laterality Date  . NO PAST SURGERIES      History reviewed. No pertinent family history.  Social History   Tobacco Use  . Smoking status: Never Smoker  . Smokeless tobacco: Never Used  Substance Use Topics  . Alcohol use: No  . Drug use: No    Allergies: No Known Allergies  Medications Prior to Admission  Medication Sig Dispense Refill Last Dose  . hydrocortisone cream 1 % Apply 1 application topically daily as needed (For breakouts.).   Taking  . phenazopyridine (PYRIDIUM) 200 MG tablet Take 1 tablet (200 mg total) by mouth 3 (three)  times daily as needed for pain. 10 tablet 0   . Prenatal Vit-Fe Fumarate-FA (MULTIVITAMIN-PRENATAL) 27-0.8 MG TABS tablet Take 1 tablet by mouth daily at 12 noon. 180 each 0 Taking  . terconazole (TERAZOL 7) 0.4 % vaginal cream Place 1 applicator vaginally at bedtime. 45 g 0     Review of Systems  Constitutional: Negative for appetite change, fatigue and fever.  HENT: Negative for congestion, dental problem, ear pain and sinus pain.   Eyes: Negative for photophobia and visual disturbance.  Gastrointestinal: Negative for abdominal pain, diarrhea and nausea.  Genitourinary: Positive for frequency. Negative for decreased urine volume, dysuria, flank pain, hematuria, pelvic pain, urgency, vaginal bleeding and vaginal discharge.  Neurological: Positive for headaches. Negative for dizziness, syncope, weakness, light-headedness and numbness.  Psychiatric/Behavioral: Negative for sleep disturbance. The patient is not nervous/anxious.    Physical Exam   Blood pressure 109/74, pulse 88, temperature 98.5 F (36.9 C), temperature source Oral, resp. rate 17, weight 67.2 kg, last menstrual period 12/18/2017, SpO2 100 %.   DT FHR 147  Physical Exam  Nursing note and vitals reviewed. Constitutional: She is oriented to person, place, and time.  HENT:  Head: Normocephalic.  Eyes: No scleral icterus.  Neck: Neck supple.  Cardiovascular: Normal rate.  Respiratory: Effort normal.  GI: Soft. There is no tenderness.  Musculoskeletal: Normal range of motion.  Neurological: She is alert and oriented to person, place, and time.  Grossly intact  Skin: Skin is warm and dry.  Psychiatric: She has a normal mood and affect.    MAU Course  Procedures  Results for orders placed or performed during the hospital encounter of 04/27/18 (from the past 24 hour(s))  Urinalysis, Routine w reflex microscopic     Status: Abnormal   Collection Time: 04/27/18  4:10 PM  Result Value Ref Range   Color, Urine STRAW (A)  YELLOW   APPearance CLEAR CLEAR   Specific Gravity, Urine 1.009 1.005 - 1.030   pH 7.0 5.0 - 8.0   Glucose, UA NEGATIVE NEGATIVE mg/dL   Hgb urine dipstick NEGATIVE NEGATIVE   Bilirubin Urine NEGATIVE NEGATIVE   Ketones, ur NEGATIVE NEGATIVE mg/dL   Protein, ur NEGATIVE NEGATIVE mg/dL   Nitrite NEGATIVE NEGATIVE   Leukocytes, UA NEGATIVE NEGATIVE   Acetaminophen 1000mg  po given> H/A resolved  Reassured re: headache and urinalysis result Assessment and Plan  Teen G1 at 3468w4d 1. Chronic tension-type headache, not intractable    Allergies as of 04/27/2018   No Known Allergies     Medication List    STOP taking these medications   phenazopyridine 200 MG tablet Commonly known as:  PYRIDIUM   terconazole 0.4 % vaginal cream Commonly known as:  TERAZOL 7     TAKE these medications   hydrocortisone cream 1 % Apply 1 application topically daily as needed (For breakouts.).   multivitamin-prenatal 27-0.8 MG Tabs tablet Take 1 tablet by mouth daily at 12 noon.      Follow-up Information    [facility] Follow up on 05/05/2018.   Contact information: [address] [place] [id number] [phone]          Caren Griffinseirdre Karysa Heft CNM 04/27/2018, 4:15 PM

## 2018-04-29 ENCOUNTER — Other Ambulatory Visit: Payer: Self-pay | Admitting: Family Medicine

## 2018-04-29 DIAGNOSIS — L309 Dermatitis, unspecified: Secondary | ICD-10-CM

## 2018-05-05 ENCOUNTER — Telehealth: Payer: Self-pay | Admitting: Family Medicine

## 2018-05-05 ENCOUNTER — Encounter: Payer: Self-pay | Admitting: Family Medicine

## 2018-05-05 ENCOUNTER — Other Ambulatory Visit: Payer: Medicaid Other

## 2018-05-05 DIAGNOSIS — Z3490 Encounter for supervision of normal pregnancy, unspecified, unspecified trimester: Secondary | ICD-10-CM | POA: Insufficient documentation

## 2018-05-05 DIAGNOSIS — Z3492 Encounter for supervision of normal pregnancy, unspecified, second trimester: Secondary | ICD-10-CM

## 2018-05-05 DIAGNOSIS — Z36 Encounter for antenatal screening for chromosomal anomalies: Secondary | ICD-10-CM | POA: Diagnosis not present

## 2018-05-05 DIAGNOSIS — Z363 Encounter for antenatal screening for malformations: Secondary | ICD-10-CM | POA: Diagnosis not present

## 2018-05-05 NOTE — Telephone Encounter (Signed)
Genetic screening ordered but never done. She stated that she will not be able to come in to her OB appointment. I advise reschedule appointment in few days. I also discussed the fact that she has not gotten her Quad screen done although she is almost 20 weeks. She stated that she is in school now and will be able to come to the lab today before 4:30 pm for lab drawn. Lab appointment made and I placed AFB order.

## 2018-06-01 ENCOUNTER — Ambulatory Visit (HOSPITAL_COMMUNITY)
Admission: RE | Admit: 2018-06-01 | Discharge: 2018-06-01 | Disposition: A | Discharge status: Home / Self Care | Payer: Medicaid Other | Source: Ambulatory Visit | Attending: Family Medicine | Admitting: Family Medicine

## 2018-06-01 DIAGNOSIS — Z3A23 23 weeks gestation of pregnancy: Secondary | ICD-10-CM | POA: Diagnosis not present

## 2018-06-01 DIAGNOSIS — Z3482 Encounter for supervision of other normal pregnancy, second trimester: Secondary | ICD-10-CM | POA: Insufficient documentation

## 2018-06-01 DIAGNOSIS — Z362 Encounter for other antenatal screening follow-up: Secondary | ICD-10-CM | POA: Insufficient documentation

## 2018-06-07 ENCOUNTER — Ambulatory Visit (INDEPENDENT_AMBULATORY_CARE_PROVIDER_SITE_OTHER): Payer: Medicaid Other | Admitting: *Deleted

## 2018-06-07 ENCOUNTER — Other Ambulatory Visit (HOSPITAL_COMMUNITY)
Admission: RE | Admit: 2018-06-07 | Discharge: 2018-06-07 | Disposition: A | Discharge status: Home / Self Care | Payer: Medicaid Other | Source: Ambulatory Visit | Attending: Family Medicine | Admitting: Family Medicine

## 2018-06-07 VITALS — BP 131/60 | HR 108 | Wt 153.4 lb

## 2018-06-07 DIAGNOSIS — N898 Other specified noninflammatory disorders of vagina: Secondary | ICD-10-CM | POA: Insufficient documentation

## 2018-06-07 LAB — POCT URINALYSIS DIP (DEVICE)
Bilirubin Urine: NEGATIVE
GLUCOSE, UA: NEGATIVE mg/dL
Hgb urine dipstick: NEGATIVE
KETONES UR: NEGATIVE mg/dL
Nitrite: NEGATIVE
Protein, ur: NEGATIVE mg/dL
SPECIFIC GRAVITY, URINE: 1.02 (ref 1.005–1.030)
Urobilinogen, UA: 0.2 mg/dL (ref 0.0–1.0)
pH: 7 (ref 5.0–8.0)

## 2018-06-07 NOTE — Progress Notes (Signed)
Pt reports she is here today for a UTI but she reports she is more concerned about vaginal discharge.  She reports the discharge is white, "not too thick but not thin like normal" and has an odor.  Pt reports she has had some burning with urination.  Pt UA showed a small amount of leukocytes.  Pt to do a self swab and a Wet prep ordered.  Advised her to make sure she mentions her symptoms when she goes to see her provider tomorrow and to tell them that we have done a wet prep.  Pt verbalized understanding.

## 2018-06-09 ENCOUNTER — Other Ambulatory Visit (HOSPITAL_COMMUNITY)
Admission: RE | Admit: 2018-06-09 | Discharge: 2018-06-09 | Disposition: A | Discharge status: Home / Self Care | Payer: Medicaid Other | Source: Ambulatory Visit | Attending: Family Medicine | Admitting: Family Medicine

## 2018-06-09 ENCOUNTER — Other Ambulatory Visit: Payer: Self-pay

## 2018-06-09 ENCOUNTER — Ambulatory Visit (INDEPENDENT_AMBULATORY_CARE_PROVIDER_SITE_OTHER): Payer: Medicaid Other | Admitting: Family Medicine

## 2018-06-09 VITALS — BP 108/66 | HR 99 | Wt 153.0 lb

## 2018-06-09 DIAGNOSIS — R300 Dysuria: Secondary | ICD-10-CM | POA: Diagnosis not present

## 2018-06-09 DIAGNOSIS — Z23 Encounter for immunization: Secondary | ICD-10-CM | POA: Diagnosis not present

## 2018-06-09 DIAGNOSIS — Z3402 Encounter for supervision of normal first pregnancy, second trimester: Secondary | ICD-10-CM | POA: Diagnosis not present

## 2018-06-09 DIAGNOSIS — Z3492 Encounter for supervision of normal pregnancy, unspecified, second trimester: Secondary | ICD-10-CM

## 2018-06-09 LAB — CERVICOVAGINAL ANCILLARY ONLY
Bacterial vaginitis: NEGATIVE
Candida vaginitis: NEGATIVE
Trichomonas: NEGATIVE

## 2018-06-09 NOTE — Progress Notes (Deleted)
  Patient Name: Sheri Nunez Date of Birth: 15-Dec-2000 Date of Visit: 06/09/18 PCP: Tillman Sers, DO  Chief Complaint: prenatal care  Subjective: [name] is a pleasant G1P0 at  [redacted]w[redacted]d based upon last menstrual.  Which is consistent with a 7-week ultrasound.  She has no unusual complaints today. Reports good fetal movement. Denies contractions, loss of fluid, or bleeding.  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   ROS Negative except for as above I have reviewed the patient's medical, surgical, family, and social history as appropriate.   Pertinent PMH: ***  Prior Obstetric History: ***  Complications of Current Pregnancy:***   Vitals:   06/09/18 1147  BP: 108/66  Pulse: 99   Last Weight  Most recent update: 06/09/2018 11:47 AM   Weight  69.4 kg (153 lb)           Weight: 140  BMI: 21.29  Filed Weights   06/09/18 1147  Weight: 153 lb (69.4 kg)      [name] was seen today for routine prenatal visit.  Diagnoses and all orders for this visit:  Supervision of normal first teen pregnancy, second trimester -     Hemoglobinopathy evaluation -     Cancel: POCT 1 Hr Prenatal Glucose -     Urine cytology ancillary only -     POCT 1 Hr Prenatal Glucose; Future  Dysuria -     Cancel: Urine Culture -     Culture, OB Urine    Anticipatory Guidance and Prenatal Education provided on the following topics: - Preterm labor signs *** - Reasons to present to MAU - Nutrition in pregnancy - Contraception postpartum - Breastfeed - Safe sleep for infant   [name], MD  [facility] Medicine Teaching Service

## 2018-06-09 NOTE — Patient Instructions (Addendum)
It was wonderful to see you today.  Thank you for choosing [facility].   Please call [phone] with any questions about today's appointment.  Please be sure to schedule follow up at the front  desk before you leave today.   [name], MD  Family Medicine    Second Trimester of Pregnancy The second trimester is from week 13 through week 28, month 4 through 6. This is often the time in pregnancy that you feel your best. Often times, morning sickness has lessened or quit. You may have more energy, and you may get hungry more often. Your unborn baby (fetus) is growing rapidly. At the end of the sixth month, he or she is about 9 inches long and weighs about 1 pounds. You will likely feel the baby move (quickening) between 18 and 20 weeks of pregnancy. Follow these instructions at home:  Avoid all smoking, herbs, and alcohol. Avoid drugs not approved by your doctor.  Do not use any tobacco products, including cigarettes, chewing tobacco, and electronic cigarettes. If you need help quitting, ask your doctor. You may get counseling or other support to help you quit.  Only take medicine as told by your doctor. Some medicines are safe and some are not during pregnancy.  Exercise only as told by your doctor. Stop exercising if you start having cramps.  Eat regular, healthy meals.  Wear a good support bra if your breasts are tender.  Do not use hot tubs, steam rooms, or saunas.  Wear your seat belt when driving.  Avoid raw meat, uncooked cheese, and liter boxes and soil used by cats.  Take your prenatal vitamins.  Take 1500-2000 milligrams of calcium daily starting at the 20th week of pregnancy until you deliver your baby.  Try taking medicine that helps you poop (stool softener) as needed, and if your doctor approves. Eat more fiber by eating fresh fruit, vegetables, and whole grains. Drink enough fluids to keep your pee (urine) clear or pale yellow.  Take warm  water baths (sitz baths) to soothe pain or discomfort caused by hemorrhoids. Use hemorrhoid cream if your doctor approves.  If you have puffy, bulging veins (varicose veins), wear support hose. Raise (elevate) your feet for 15 minutes, 3-4 times a day. Limit salt in your diet.  Avoid heavy lifting, wear low heals, and sit up straight.  Rest with your legs raised if you have leg cramps or low back pain.  Visit your dentist if you have not gone during your pregnancy. Use a soft toothbrush to brush your teeth. Be gentle when you floss.  You can have sex (intercourse) unless your doctor tells you not to.  Go to your doctor visits. Get help if:  You feel dizzy.  You have mild cramps or pressure in your lower belly (abdomen).  You have a nagging pain in your belly area.  You continue to feel sick to your stomach (nauseous), throw up (vomit), or have watery poop (diarrhea).  You have bad smelling fluid coming from your vagina.  You have pain with peeing (urination). Get help right away if:  You have a fever.  You are leaking fluid from your vagina.  You have spotting or bleeding from your vagina.  You have severe belly cramping or pain.  You lose or gain weight rapidly.  You have trouble catching your breath and have chest pain.  You notice sudden or extreme puffiness (swelling) of your face, hands, ankles, feet, or legs.  You  have not felt the baby move in over an hour.  You have severe headaches that do not go away with medicine.  You have vision changes. This information is not intended to replace advice given to you by your health care provider. Make sure you discuss any questions you have with your health care provider. Document Released: 11/25/2009 Document Revised: 02/06/2016 Document Reviewed: 11/01/2012 Elsevier Interactive Patient Education  2017 [employer].

## 2018-06-09 NOTE — Progress Notes (Signed)
  Patient Name: Sheri Nunez Date of Birth: 07/14/2001 Date of Visit: 06/09/18 PCP: Tillman Sers, DO  Chief Complaint: prenatal care  Subjective: [name] is a pleasant G1P0 at  [redacted]w[redacted]d based upon last menstrual.  Which is consistent with a 7-week ultrasound.   Reports good fetal movement. Denies contractions, loss of fluid, or bleeding.  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  She reports several days of burning with urination.  Denies fevers nausea, vomiting, abnormal discharge, she was seen in MAU.  Wet mount was negative.   ROS Negative except for as above I have reviewed the patient's medical, surgical, family, and social history as appropriate.   Pertinent PMH: Chlamydia diagnosed in April   Prior Obstetric History: First pregnancy   Complications of Current Pregnancy:Teen pregnancy, Chlamydia in pregnancy, has not yet had sickle cell electrophoresis    Vitals:   06/09/18 1147  BP: 108/66  Pulse: 99   Last Weight  Most recent update: 06/09/2018 11:47 AM   Weight  69.4 kg (153 lb)           Weight: 140  BMI: 21.29  Filed Weights   06/09/18 1147  Weight: 153 lb (69.4 kg)   See prenatal flow sheet.    [name] was seen today for routine prenatal visit.  Diagnoses and all orders for this visit:  Supervision of normal first teen pregnancy, second trimester -     Hemoglobinopathy evaluation -     POCT 1 Hr Prenatal Glucose; Future  Dysuria, possibly UTI. Has not intercourse in 2 months. Repeat GC/CT.  -     Urine cytology ancillary only -     Urine culture   Need for immunization against influenza -     Flu Vaccine QUAD 36+ mos IM  Anticipatory Guidance and Prenatal Education provided on the following topics: - Preterm labor signs  - Reasons to present to MAU - Nutrition in pregnancy - Contraception postpartum - Breastfeed - Safe sleep for infant   [name], MD  [facility] Medicine Teaching Service

## 2018-06-10 LAB — URINE CYTOLOGY ANCILLARY ONLY
CHLAMYDIA, DNA PROBE: NEGATIVE
NEISSERIA GONORRHEA: NEGATIVE

## 2018-06-10 NOTE — Progress Notes (Signed)
Chart reviewed for nurse visit. Agree with plan of care.   Dvid Pendry Lorraine, CNM 06/10/2018 5:37 AM   

## 2018-06-13 LAB — HEMOGLOBINOPATHY EVALUATION
HEMOGLOBIN A2 QUANTITATION: 2.4 % (ref 1.8–3.2)
HEMOGLOBIN F QUANTITATION: 0 % (ref 0.0–2.0)
HGB A: 97.6 % (ref 96.4–98.8)
HGB C: 0 %
HGB S: 0 %
HGB VARIANT: 0 %

## 2018-06-13 LAB — URINE CULTURE, OB REFLEX

## 2018-06-13 LAB — CULTURE, OB URINE

## 2018-06-27 ENCOUNTER — Other Ambulatory Visit (INDEPENDENT_AMBULATORY_CARE_PROVIDER_SITE_OTHER): Payer: Medicaid Other

## 2018-06-27 ENCOUNTER — Ambulatory Visit (INDEPENDENT_AMBULATORY_CARE_PROVIDER_SITE_OTHER): Payer: Medicaid Other | Admitting: Family Medicine

## 2018-06-27 ENCOUNTER — Other Ambulatory Visit: Payer: Self-pay

## 2018-06-27 VITALS — BP 100/62 | HR 95 | Temp 98.9°F | Wt 160.0 lb

## 2018-06-27 DIAGNOSIS — Z3A27 27 weeks gestation of pregnancy: Secondary | ICD-10-CM

## 2018-06-27 DIAGNOSIS — Z3402 Encounter for supervision of normal first pregnancy, second trimester: Secondary | ICD-10-CM

## 2018-06-27 DIAGNOSIS — Z3492 Encounter for supervision of normal pregnancy, unspecified, second trimester: Secondary | ICD-10-CM

## 2018-06-27 DIAGNOSIS — Z23 Encounter for immunization: Secondary | ICD-10-CM | POA: Diagnosis not present

## 2018-06-27 LAB — POCT 1 HR PRENATAL GLUCOSE: GLUCOSE 1 HR PRENATAL, POC: 98 mg/dL

## 2018-06-27 NOTE — Progress Notes (Signed)
  Sheri Nunez is a 17 y.o. G1P0 at [redacted]w[redacted]d here for routine follow up.  She reports positive fetal movement, no LOF, no vaginal bleeding, no contractions. She denies urinary symptoms.. See flow sheet for details.  Vitals:   06/27/18 1337  BP: 100/62  Pulse: 95  Temp: 98.9 F (37.2 C)    A/P: Pregnancy at [redacted]w[redacted]d.  Doing well.   Pregnancy issues include teen pregnancy, prior UTI, hx chlamydia treated with two negative tests since then. Preterm labor and fetal movement precautions reviewed.  -UTI syx at last visit, will repeat urine culture today -1 hour glucola done today and normal  Follow up 4 weeks.  Dolores Patty, DO PGY-3, [facility] 06/27/2018 3:32 PM

## 2018-06-27 NOTE — Patient Instructions (Addendum)
Good to see you today! We'll see you back in about 3 weeks when you are [redacted] weeks along. If you have any bleeding or pain please go directly to the MAU at [facility]. If you have questions or concerns please do not hesitate to call at [phone].  Dolores Patty, DO PGY-3, [facility] 06/27/2018 1:54 PM   Third Trimester of Pregnancy The third trimester is from week 28 through week 40 (months 7 through 9). The third trimester is a time when the unborn baby (fetus) is growing rapidly. At the end of the ninth month, the fetus is about 20 inches in length and weighs 6-10 pounds. Body changes during your third trimester Your body will continue to go through many changes during pregnancy. The changes vary from woman to woman. During the third trimester:  Your weight will continue to increase. You can expect to gain 25-35 pounds (11-16 kg) by the end of the pregnancy.  You may begin to get stretch marks on your hips, abdomen, and breasts.  You may urinate more often because the fetus is moving lower into your pelvis and pressing on your bladder.  You may develop or continue to have heartburn. This is caused by increased hormones that slow down muscles in the digestive tract.  You may develop or continue to have constipation because increased hormones slow digestion and cause the muscles that push waste through your intestines to relax.  You may develop hemorrhoids. These are swollen veins (varicose veins) in the rectum that can itch or be painful.  You may develop swollen, bulging veins (varicose veins) in your legs.  You may have increased body aches in the pelvis, back, or thighs. This is due to weight gain and increased hormones that are relaxing your joints.  You may have changes in your hair. These can include thickening of your hair, rapid growth, and changes in texture. Some women also have hair loss during or after pregnancy, or hair that feels dry or thin. Your  hair will most likely return to normal after your baby is born.  Your breasts will continue to grow and they will continue to become tender. A yellow fluid (colostrum) may leak from your breasts. This is the first milk you are producing for your baby.  Your belly button may stick out.  You may notice more swelling in your hands, face, or ankles.  You may have increased tingling or numbness in your hands, arms, and legs. The skin on your belly may also feel numb.  You may feel short of breath because of your expanding uterus.  You may have more problems sleeping. This can be caused by the size of your belly, increased need to urinate, and an increase in your body's metabolism.  You may notice the fetus "dropping," or moving lower in your abdomen (lightening).  You may have increased vaginal discharge.  You may notice your joints feel loose and you may have pain around your pelvic bone.  What to expect at prenatal visits You will have prenatal exams every 2 weeks until week 36. Then you will have weekly prenatal exams. During a routine prenatal visit:  You will be weighed to make sure you and the baby are growing normally.  Your blood pressure will be taken.  Your abdomen will be measured to track your baby's growth.  The fetal heartbeat will be listened to.  Any test results from the previous visit will be discussed.  You may have a cervical  check near your due date to see if your cervix has softened or thinned (effaced).  You will be tested for Group B streptococcus. This happens between 35 and 37 weeks.  Your health care provider may ask you:  What your birth plan is.  How you are feeling.  If you are feeling the baby move.  If you have had any abnormal symptoms, such as leaking fluid, bleeding, severe headaches, or abdominal cramping.  If you are using any tobacco products, including cigarettes, chewing tobacco, and electronic cigarettes.  If you have any  questions.  Other tests or screenings that may be performed during your third trimester include:  Blood tests that check for low iron levels (anemia).  Fetal testing to check the health, activity level, and growth of the fetus. Testing is done if you have certain medical conditions or if there are problems during the pregnancy.  Nonstress test (NST). This test checks the health of your baby to make sure there are no signs of problems, such as the baby not getting enough oxygen. During this test, a belt is placed around your belly. The baby is made to move, and its heart rate is monitored during movement.  What is false labor? False labor is a condition in which you feel small, irregular tightenings of the muscles in the womb (contractions) that usually go away with rest, changing position, or drinking water. These are called Braxton Hicks contractions. Contractions may last for hours, days, or even weeks before true labor sets in. If contractions come at regular intervals, become more frequent, increase in intensity, or become painful, you should see your health care provider. What are the signs of labor?  Abdominal cramps.  Regular contractions that start at 10 minutes apart and become stronger and more frequent with time.  Contractions that start on the top of the uterus and spread down to the lower abdomen and back.  Increased pelvic pressure and dull back pain.  A watery or bloody mucus discharge that comes from the vagina.  Leaking of amniotic fluid. This is also known as your "water breaking." It could be a slow trickle or a gush. Let your health care provider know if it has a color or strange odor. If you have any of these signs, call your health care provider right away, even if it is before your due date. Follow these instructions at home: Medicines  Follow your health care provider's instructions regarding medicine use. Specific medicines may be either safe or unsafe to take  during pregnancy.  Take a prenatal vitamin that contains at least 600 micrograms (mcg) of folic acid.  If you develop constipation, try taking a stool softener if your health care provider approves. Eating and drinking  Eat a balanced diet that includes fresh fruits and vegetables, whole grains, good sources of protein such as meat, eggs, or tofu, and low-fat dairy. Your health care provider will help you determine the amount of weight gain that is right for you.  Avoid raw meat and uncooked cheese. These carry germs that can cause birth defects in the baby.  If you have low calcium intake from food, talk to your health care provider about whether you should take a daily calcium supplement.  Eat four or five small meals rather than three large meals a day.  Limit foods that are high in fat and processed sugars, such as fried and sweet foods.  To prevent constipation: ? Drink enough fluid to keep your urine clear or  pale yellow. ? Eat foods that are high in fiber, such as fresh fruits and vegetables, whole grains, and beans. Activity  Exercise only as directed by your health care provider. Most women can continue their usual exercise routine during pregnancy. Try to exercise for 30 minutes at least 5 days a week. Stop exercising if you experience uterine contractions.  Avoid heavy lifting.  Do not exercise in extreme heat or humidity, or at high altitudes.  Wear low-heel, comfortable shoes.  Practice good posture.  You may continue to have sex unless your health care provider tells you otherwise. Relieving pain and discomfort  Take frequent breaks and rest with your legs elevated if you have leg cramps or low back pain.  Take warm sitz baths to soothe any pain or discomfort caused by hemorrhoids. Use hemorrhoid cream if your health care provider approves.  Wear a good support bra to prevent discomfort from breast tenderness.  If you develop varicose veins: ? Wear support  pantyhose or compression stockings as told by your healthcare provider. ? Elevate your feet for 15 minutes, 3-4 times a day. Prenatal care  Write down your questions. Take them to your prenatal visits.  Keep all your prenatal visits as told by your health care provider. This is important. Safety  Wear your seat belt at all times when driving.  Make a list of emergency phone numbers, including numbers for family, friends, the hospital, and police and fire departments. General instructions  Avoid cat litter boxes and soil used by cats. These carry germs that can cause birth defects in the baby. If you have a cat, ask someone to clean the litter box for you.  Do not travel far distances unless it is absolutely necessary and only with the approval of your health care provider.  Do not use hot tubs, steam rooms, or saunas.  Do not drink alcohol.  Do not use any products that contain nicotine or tobacco, such as cigarettes and e-cigarettes. If you need help quitting, ask your health care provider.  Do not use any medicinal herbs or unprescribed drugs. These chemicals affect the formation and growth of the baby.  Do not douche or use tampons or scented sanitary pads.  Do not cross your legs for long periods of time.  To prepare for the arrival of your baby: ? Take prenatal classes to understand, practice, and ask questions about labor and delivery. ? Make a trial run to the hospital. ? Visit the hospital and tour the maternity area. ? Arrange for maternity or paternity leave through employers. ? Arrange for family and friends to take care of pets while you are in the hospital. ? Purchase a rear-facing car seat and make sure you know how to install it in your car. ? Pack your hospital bag. ? Prepare the baby's nursery. Make sure to remove all pillows and stuffed animals from the baby's crib to prevent suffocation.  Visit your dentist if you have not gone during your pregnancy. Use a  soft toothbrush to brush your teeth and be gentle when you floss. Contact a health care provider if:  You are unsure if you are in labor or if your water has broken.  You become dizzy.  You have mild pelvic cramps, pelvic pressure, or nagging pain in your abdominal area.  You have lower back pain.  You have persistent nausea, vomiting, or diarrhea.  You have an unusual or bad smelling vaginal discharge.  You have pain when you urinate. Get  help right away if:  Your water breaks before 37 weeks.  You have regular contractions less than 5 minutes apart before 37 weeks.  You have a fever.  You are leaking fluid from your vagina.  You have spotting or bleeding from your vagina.  You have severe abdominal pain or cramping.  You have rapid weight loss or weight gain.  You have shortness of breath with chest pain.  You notice sudden or extreme swelling of your face, hands, ankles, feet, or legs.  Your baby makes fewer than 10 movements in 2 hours.  You have severe headaches that do not go away when you take medicine.  You have vision changes. Summary  The third trimester is from week 28 through week 40, months 7 through 9. The third trimester is a time when the unborn baby (fetus) is growing rapidly.  During the third trimester, your discomfort may increase as you and your baby continue to gain weight. You may have abdominal, leg, and back pain, sleeping problems, and an increased need to urinate.  During the third trimester your breasts will keep growing and they will continue to become tender. A yellow fluid (colostrum) may leak from your breasts. This is the first milk you are producing for your baby.  False labor is a condition in which you feel small, irregular tightenings of the muscles in the womb (contractions) that eventually go away. These are called Braxton Hicks contractions. Contractions may last for hours, days, or even weeks before true labor sets in.  Signs  of labor can include: abdominal cramps; regular contractions that start at 10 minutes apart and become stronger and more frequent with time; watery or bloody mucus discharge that comes from the vagina; increased pelvic pressure and dull back pain; and leaking of amniotic fluid. This information is not intended to replace advice given to you by your health care provider. Make sure you discuss any questions you have with your health care provider. Document Released: 08/25/2001 Document Revised: 02/06/2016 Document Reviewed: 11/01/2012 Elsevier Interactive Patient Education  2017 [employer].

## 2018-06-29 LAB — URINE CULTURE

## 2018-07-18 ENCOUNTER — Other Ambulatory Visit (HOSPITAL_COMMUNITY)
Admission: RE | Admit: 2018-07-18 | Discharge: 2018-07-18 | Disposition: A | Discharge status: Home / Self Care | Payer: Medicaid Other | Source: Ambulatory Visit | Attending: Family Medicine | Admitting: Family Medicine

## 2018-07-18 ENCOUNTER — Ambulatory Visit (INDEPENDENT_AMBULATORY_CARE_PROVIDER_SITE_OTHER): Payer: Medicaid Other | Admitting: Family Medicine

## 2018-07-18 ENCOUNTER — Other Ambulatory Visit: Payer: Self-pay

## 2018-07-18 VITALS — BP 116/66 | HR 90 | Temp 98.6°F | Wt 160.0 lb

## 2018-07-18 DIAGNOSIS — O26893 Other specified pregnancy related conditions, third trimester: Secondary | ICD-10-CM

## 2018-07-18 DIAGNOSIS — Z3403 Encounter for supervision of normal first pregnancy, third trimester: Secondary | ICD-10-CM | POA: Diagnosis not present

## 2018-07-18 DIAGNOSIS — N898 Other specified noninflammatory disorders of vagina: Secondary | ICD-10-CM

## 2018-07-18 DIAGNOSIS — Z3A30 30 weeks gestation of pregnancy: Secondary | ICD-10-CM | POA: Insufficient documentation

## 2018-07-18 LAB — POCT URINALYSIS DIP (MANUAL ENTRY)
Bilirubin, UA: NEGATIVE
Blood, UA: NEGATIVE
Glucose, UA: NEGATIVE mg/dL
Ketones, POC UA: NEGATIVE mg/dL
Leukocytes, UA: NEGATIVE
Nitrite, UA: NEGATIVE
PH UA: 7.5 (ref 5.0–8.0)
PROTEIN UA: NEGATIVE mg/dL
SPEC GRAV UA: 1.02 (ref 1.010–1.025)
Urobilinogen, UA: 1 E.U./dL

## 2018-07-18 LAB — POCT WET PREP (WET MOUNT)
CLUE CELLS WET PREP WHIFF POC: NEGATIVE
TRICHOMONAS WET PREP HPF POC: ABSENT

## 2018-07-18 NOTE — Progress Notes (Signed)
  Subjective:    Sheri Nunez is a 17 y.o. G1P0 [redacted]w[redacted]d here for routine follow up.  She reports positive fetal movement, no LOF, some vaginal spotting, no contractions. She reports dysuria. See flow sheet for details.  Objective:    BP 116/66   Pulse 90   Temp 98.6 F (37 C)   Wt 160 lb (72.6 kg)   LMP 12/18/2017 (Exact Date)   BMI 24.33 kg/m   Physical Exam  Constitutional: She appears well-developed and well-nourished.  Respiratory: Effort normal.  Genitourinary: Vaginal discharge found.  Genitourinary Comments: Cervix closed, no blood noted on speculum exam, some thin white discharge noted with no odor.   Musculoskeletal: Normal range of motion.    Maternal Exam:  Abdomen: Patient reports no abdominal tenderness. Fundal height is 29cm.   Fetal presentation: no presenting part  Introitus: Normal vulva. Vagina is positive for vaginal discharge and edema.  Ferning test: not done.  Nitrazine test: not done. Amniotic fluid character: clear.  Cervix: Cervix evaluated by sterile speculum exam.      FHT: Fetal Heart Rate (bpm): 128  Uterine Size: Fundal Height: 29 cm  Presentation:      A/P: Pregnancy at [redacted]w[redacted]d. Doing well.   Pregnancy issues include teen pregnancy, prior UTI, hx chlamydia treated with two negative tests since then. Repeat Urine culture at [redacted]w[redacted]d was negative.   Patient with spotting yesterday, none today. Performed wet prep, GC/Chlymadia and UA with reflex culture given dysuria. Given strict return precautions if more bleeding noted, Patient cervix closed on exam with no blood noted in vaginal canal. Denies contractions.   Preterm labor and fetal movement precautions reviewed.  Pediatrician: discussed. Health care proxy: discussed. Infant feeding: plans to breastfeed. Maternity leave: discussed. Plans to have IUD for contraception.  Follow up in 2 Weeks.     [place] Sharifah Champine, DO PGY-2, [facility]

## 2018-07-18 NOTE — Patient Instructions (Addendum)
Thank you for coming to see me today. It was a pleasure!  If you have any contractions which occur 7-10 minutes apart, vaginal bleeding, fluid leaking, or are worried that baby is not moving well, go immediately to [facility] to be evaluated.  We will call you with your lab results.   Please follow-up with in 2 weeks or sooner as needed.  If you have any questions or concerns, please do not hesitate to call the office at [phone].  Take Care,   [place] Katrin Grabel, DO   Third Trimester of Pregnancy The third trimester is from week 28 through week 40 (months 7 through 9). The third trimester is a time when the unborn baby (fetus) is growing rapidly. At the end of the ninth month, the fetus is about 20 inches in length and weighs 6-10 pounds. Body changes during your third trimester Your body will continue to go through many changes during pregnancy. The changes vary from woman to woman. During the third trimester:  Your weight will continue to increase. You can expect to gain 25-35 pounds (11-16 kg) by the end of the pregnancy.  You may begin to get stretch marks on your hips, abdomen, and breasts.  You may urinate more often because the fetus is moving lower into your pelvis and pressing on your bladder.  You may develop or continue to have heartburn. This is caused by increased hormones that slow down muscles in the digestive tract.  You may develop or continue to have constipation because increased hormones slow digestion and cause the muscles that push waste through your intestines to relax.  You may develop hemorrhoids. These are swollen veins (varicose veins) in the rectum that can itch or be painful.  You may develop swollen, bulging veins (varicose veins) in your legs.  You may have increased body aches in the pelvis, back, or thighs. This is due to weight gain and increased hormones that are relaxing your joints.  You may have changes in your hair. These can include  thickening of your hair, rapid growth, and changes in texture. Some women also have hair loss during or after pregnancy, or hair that feels dry or thin. Your hair will most likely return to normal after your baby is born.  Your breasts will continue to grow and they will continue to become tender. A yellow fluid (colostrum) may leak from your breasts. This is the first milk you are producing for your baby.  Your belly button may stick out.  You may notice more swelling in your hands, face, or ankles.  You may have increased tingling or numbness in your hands, arms, and legs. The skin on your belly may also feel numb.  You may feel short of breath because of your expanding uterus.  You may have more problems sleeping. This can be caused by the size of your belly, increased need to urinate, and an increase in your body's metabolism.  You may notice the fetus "dropping," or moving lower in your abdomen (lightening).  You may have increased vaginal discharge.  You may notice your joints feel loose and you may have pain around your pelvic bone.  What to expect at prenatal visits You will have prenatal exams every 2 weeks until week 36. Then you will have weekly prenatal exams. During a routine prenatal visit:  You will be weighed to make sure you and the baby are growing normally.  Your blood pressure will be taken.  Your abdomen will be  measured to track your baby's growth.  The fetal heartbeat will be listened to.  Any test results from the previous visit will be discussed.  You may have a cervical check near your due date to see if your cervix has softened or thinned (effaced).  You will be tested for Group B streptococcus. This happens between 35 and 37 weeks.  Your health care provider may ask you:  What your birth plan is.  How you are feeling.  If you are feeling the baby move.  If you have had any abnormal symptoms, such as leaking fluid, bleeding, severe headaches, or  abdominal cramping.  If you are using any tobacco products, including cigarettes, chewing tobacco, and electronic cigarettes.  If you have any questions.  Other tests or screenings that may be performed during your third trimester include:  Blood tests that check for low iron levels (anemia).  Fetal testing to check the health, activity level, and growth of the fetus. Testing is done if you have certain medical conditions or if there are problems during the pregnancy.  Nonstress test (NST). This test checks the health of your baby to make sure there are no signs of problems, such as the baby not getting enough oxygen. During this test, a belt is placed around your belly. The baby is made to move, and its heart rate is monitored during movement.  What is false labor? False labor is a condition in which you feel small, irregular tightenings of the muscles in the womb (contractions) that usually go away with rest, changing position, or drinking water. These are called Braxton Hicks contractions. Contractions may last for hours, days, or even weeks before true labor sets in. If contractions come at regular intervals, become more frequent, increase in intensity, or become painful, you should see your health care provider. What are the signs of labor?  Abdominal cramps.  Regular contractions that start at 10 minutes apart and become stronger and more frequent with time.  Contractions that start on the top of the uterus and spread down to the lower abdomen and back.  Increased pelvic pressure and dull back pain.  A watery or bloody mucus discharge that comes from the vagina.  Leaking of amniotic fluid. This is also known as your "water breaking." It could be a slow trickle or a gush. Let your health care provider know if it has a color or strange odor. If you have any of these signs, call your health care provider right away, even if it is before your due date. Follow these instructions at  home: Medicines  Follow your health care provider's instructions regarding medicine use. Specific medicines may be either safe or unsafe to take during pregnancy.  Take a prenatal vitamin that contains at least 600 micrograms (mcg) of folic acid.  If you develop constipation, try taking a stool softener if your health care provider approves. Eating and drinking  Eat a balanced diet that includes fresh fruits and vegetables, whole grains, good sources of protein such as meat, eggs, or tofu, and low-fat dairy. Your health care provider will help you determine the amount of weight gain that is right for you.  Avoid raw meat and uncooked cheese. These carry germs that can cause birth defects in the baby.  If you have low calcium intake from food, talk to your health care provider about whether you should take a daily calcium supplement.  Eat four or five small meals rather than three large meals a day.  Limit foods that are high in fat and processed sugars, such as fried and sweet foods.  To prevent constipation: ? Drink enough fluid to keep your urine clear or pale yellow. ? Eat foods that are high in fiber, such as fresh fruits and vegetables, whole grains, and beans. Activity  Exercise only as directed by your health care provider. Most women can continue their usual exercise routine during pregnancy. Try to exercise for 30 minutes at least 5 days a week. Stop exercising if you experience uterine contractions.  Avoid heavy lifting.  Do not exercise in extreme heat or humidity, or at high altitudes.  Wear low-heel, comfortable shoes.  Practice good posture.  You may continue to have sex unless your health care provider tells you otherwise. Relieving pain and discomfort  Take frequent breaks and rest with your legs elevated if you have leg cramps or low back pain.  Take warm sitz baths to soothe any pain or discomfort caused by hemorrhoids. Use hemorrhoid cream if your health  care provider approves.  Wear a good support bra to prevent discomfort from breast tenderness.  If you develop varicose veins: ? Wear support pantyhose or compression stockings as told by your healthcare provider. ? Elevate your feet for 15 minutes, 3-4 times a day. Prenatal care  Write down your questions. Take them to your prenatal visits.  Keep all your prenatal visits as told by your health care provider. This is important. Safety  Wear your seat belt at all times when driving.  Make a list of emergency phone numbers, including numbers for family, friends, the hospital, and police and fire departments. General instructions  Avoid cat litter boxes and soil used by cats. These carry germs that can cause birth defects in the baby. If you have a cat, ask someone to clean the litter box for you.  Do not travel far distances unless it is absolutely necessary and only with the approval of your health care provider.  Do not use hot tubs, steam rooms, or saunas.  Do not drink alcohol.  Do not use any products that contain nicotine or tobacco, such as cigarettes and e-cigarettes. If you need help quitting, ask your health care provider.  Do not use any medicinal herbs or unprescribed drugs. These chemicals affect the formation and growth of the baby.  Do not douche or use tampons or scented sanitary pads.  Do not cross your legs for long periods of time.  To prepare for the arrival of your baby: ? Take prenatal classes to understand, practice, and ask questions about labor and delivery. ? Make a trial run to the hospital. ? Visit the hospital and tour the maternity area. ? Arrange for maternity or paternity leave through employers. ? Arrange for family and friends to take care of pets while you are in the hospital. ? Purchase a rear-facing car seat and make sure you know how to install it in your car. ? Pack your hospital bag. ? Prepare the baby's nursery. Make sure to remove all  pillows and stuffed animals from the baby's crib to prevent suffocation.  Visit your dentist if you have not gone during your pregnancy. Use a soft toothbrush to brush your teeth and be gentle when you floss. Contact a health care provider if:  You are unsure if you are in labor or if your water has broken.  You become dizzy.  You have mild pelvic cramps, pelvic pressure, or nagging pain in your abdominal area.  You  have lower back pain.  You have persistent nausea, vomiting, or diarrhea.  You have an unusual or bad smelling vaginal discharge.  You have pain when you urinate. Get help right away if:  Your water breaks before 37 weeks.  You have regular contractions less than 5 minutes apart before 37 weeks.  You have a fever.  You are leaking fluid from your vagina.  You have spotting or bleeding from your vagina.  You have severe abdominal pain or cramping.  You have rapid weight loss or weight gain.  You have shortness of breath with chest pain.  You notice sudden or extreme swelling of your face, hands, ankles, feet, or legs.  Your baby makes fewer than 10 movements in 2 hours.  You have severe headaches that do not go away when you take medicine.  You have vision changes. Summary  The third trimester is from week 28 through week 40, months 7 through 9. The third trimester is a time when the unborn baby (fetus) is growing rapidly.  During the third trimester, your discomfort may increase as you and your baby continue to gain weight. You may have abdominal, leg, and back pain, sleeping problems, and an increased need to urinate.  During the third trimester your breasts will keep growing and they will continue to become tender. A yellow fluid (colostrum) may leak from your breasts. This is the first milk you are producing for your baby.  False labor is a condition in which you feel small, irregular tightenings of the muscles in the womb (contractions) that  eventually go away. These are called Braxton Hicks contractions. Contractions may last for hours, days, or even weeks before true labor sets in.  Signs of labor can include: abdominal cramps; regular contractions that start at 10 minutes apart and become stronger and more frequent with time; watery or bloody mucus discharge that comes from the vagina; increased pelvic pressure and dull back pain; and leaking of amniotic fluid. This information is not intended to replace advice given to you by your health care provider. Make sure you discuss any questions you have with your health care provider. Document Released: 08/25/2001 Document Revised: 02/06/2016 Document Reviewed: 11/01/2012 Elsevier Interactive Patient Education  2017 [employer].   Contraception Choices Contraception, also called birth control, refers to methods or devices that prevent pregnancy. Hormonal methods Contraceptive implant A contraceptive implant is a thin, plastic tube that contains a hormone. It is inserted into the upper part of the arm. It can remain in place for up to 3 years. Progestin-only injections Progestin-only injections are injections of progestin, a synthetic form of the hormone progesterone. They are given every 3 months by a health care provider. Birth control pills Birth control pills are pills that contain hormones that prevent pregnancy. They must be taken once a day, preferably at the same time each day. Birth control patch The birth control patch contains hormones that prevent pregnancy. It is placed on the skin and must be changed once a week for three weeks and removed on the fourth week. A prescription is needed to use this method of contraception. Vaginal ring A vaginal ring contains hormones that prevent pregnancy. It is placed in the vagina for three weeks and removed on the fourth week. After that, the process is repeated with a new ring. A prescription is needed to use this method of  contraception. Emergency contraceptive Emergency contraceptives prevent pregnancy after unprotected sex. They come in pill form and can be taken  up to 5 days after sex. They work best the sooner they are taken after having sex. Most emergency contraceptives are available without a prescription. This method should not be used as your only form of birth control. Barrier methods Female condom A female condom is a thin sheath that is worn over the penis during sex. Condoms keep sperm from going inside a woman's body. They can be used with a spermicide to increase their effectiveness. They should be disposed after a single use. Female condom A female condom is a soft, loose-fitting sheath that is put into the vagina before sex. The condom keeps sperm from going inside a woman's body. They should be disposed after a single use. Diaphragm A diaphragm is a soft, dome-shaped barrier. It is inserted into the vagina before sex, along with a spermicide. The diaphragm blocks sperm from entering the uterus, and the spermicide kills sperm. A diaphragm should be left in the vagina for 6-8 hours after sex and removed within 24 hours. A diaphragm is prescribed and fitted by a health care provider. A diaphragm should be replaced every 1-2 years, after giving birth, after gaining more than 15 lb (6.8 kg), and after pelvic surgery. Cervical cap A cervical cap is a round, soft latex or plastic cup that fits over the cervix. It is inserted into the vagina before sex, along with spermicide. It blocks sperm from entering the uterus. The cap should be left in place for 6-8 hours after sex and removed within 48 hours. A cervical cap must be prescribed and fitted by a health care provider. It should be replaced every 2 years. Sponge A sponge is a soft, circular piece of polyurethane foam with spermicide on it. The sponge helps block sperm from entering the uterus, and the spermicide kills sperm. To use it, you make it wet and then  insert it into the vagina. It should be inserted before sex, left in for at least 6 hours after sex, and removed and thrown away within 30 hours. Spermicides Spermicides are chemicals that kill or block sperm from entering the cervix and uterus. They can come as a cream, jelly, suppository, foam, or tablet. A spermicide should be inserted into the vagina with an applicator at least 10-15 minutes before sex to allow time for it to work. The process must be repeated every time you have sex. Spermicides do not require a prescription. Intrauterine contraception Intrauterine device (IUD) An IUD is a T-shaped device that is put in a woman's uterus. There are two types:  Hormone IUD.This type contains progestin, a synthetic form of the hormone progesterone. This type can stay in place for 3-5 years.  Copper IUD.This type is wrapped in copper wire. It can stay in place for 10 years.  Permanent methods of contraception Female tubal ligation In this method, a woman's fallopian tubes are sealed, tied, or blocked during surgery to prevent eggs from traveling to the uterus. Hysteroscopic sterilization In this method, a small, flexible insert is placed into each fallopian tube. The inserts cause scar tissue to form in the fallopian tubes and block them, so sperm cannot reach an egg. The procedure takes about 3 months to be effective. Another form of birth control must be used during those 3 months. Female sterilization This is a procedure to tie off the tubes that carry sperm (vasectomy). After the procedure, the man can still ejaculate fluid (semen). Natural planning methods Natural family planning In this method, a couple does not have sex on  days when the woman could become pregnant. Calendar method This means keeping track of the length of each menstrual cycle, identifying the days when pregnancy can happen, and not having sex on those days. Ovulation method In this method, a couple avoids sex during  ovulation. Symptothermal method This method involves not having sex during ovulation. The woman typically checks for ovulation by watching changes in her temperature and in the consistency of cervical mucus. Post-ovulation method In this method, a couple waits to have sex until after ovulation. Summary  Contraception, also called birth control, means methods or devices that prevent pregnancy.  Hormonal methods of contraception include implants, injections, pills, patches, vaginal rings, and emergency contraceptives.  Barrier methods of contraception can include female condoms, female condoms, diaphragms, cervical caps, sponges, and spermicides.  There are two types of IUDs (intrauterine devices). An IUD can be put in a woman's uterus to prevent pregnancy for 3-5 years.  Permanent sterilization can be done through a procedure for males, females, or both.  Natural family planning methods involve not having sex on days when the woman could become pregnant. This information is not intended to replace advice given to you by your health care provider. Make sure you discuss any questions you have with your health care provider. Document Released: 08/31/2005 Document Revised: 10/03/2016 Document Reviewed: 10/03/2016 Elsevier Interactive Patient Education  2018 [employer].

## 2018-07-19 LAB — CERVICOVAGINAL ANCILLARY ONLY
Chlamydia: NEGATIVE
Neisseria Gonorrhea: NEGATIVE

## 2018-07-19 LAB — CBC
HEMATOCRIT: 29.8 % — AB (ref 34.0–46.6)
Hemoglobin: 10.5 g/dL — ABNORMAL LOW (ref 11.1–15.9)
MCH: 31.3 pg (ref 26.6–33.0)
MCHC: 35.2 g/dL (ref 31.5–35.7)
MCV: 89 fL (ref 79–97)
Platelets: 353 10*3/uL (ref 150–450)
RBC: 3.36 x10E6/uL — ABNORMAL LOW (ref 3.77–5.28)
RDW: 12.5 % (ref 12.3–15.4)
WBC: 7.5 10*3/uL (ref 3.4–10.8)

## 2018-07-19 LAB — HIV ANTIBODY (ROUTINE TESTING W REFLEX): HIV Screen 4th Generation wRfx: NONREACTIVE

## 2018-07-19 LAB — RPR: RPR Ser Ql: NONREACTIVE

## 2018-07-20 LAB — URINE CULTURE, OB REFLEX: Organism ID, Bacteria: NO GROWTH

## 2018-07-20 LAB — CULTURE, OB URINE

## 2018-07-30 ENCOUNTER — Inpatient Hospital Stay (HOSPITAL_COMMUNITY)
Admission: AD | Admit: 2018-07-30 | Discharge: 2018-07-30 | Disposition: A | Discharge status: Home / Self Care | Payer: Medicaid Other | Source: Ambulatory Visit | Attending: Obstetrics & Gynecology | Admitting: Obstetrics & Gynecology

## 2018-07-30 ENCOUNTER — Encounter (HOSPITAL_COMMUNITY): Payer: Self-pay | Admitting: *Deleted

## 2018-07-30 DIAGNOSIS — B379 Candidiasis, unspecified: Secondary | ICD-10-CM | POA: Diagnosis not present

## 2018-07-30 DIAGNOSIS — R109 Unspecified abdominal pain: Secondary | ICD-10-CM

## 2018-07-30 DIAGNOSIS — O98813 Other maternal infectious and parasitic diseases complicating pregnancy, third trimester: Secondary | ICD-10-CM | POA: Diagnosis not present

## 2018-07-30 DIAGNOSIS — Z79899 Other long term (current) drug therapy: Secondary | ICD-10-CM | POA: Diagnosis not present

## 2018-07-30 DIAGNOSIS — Z3689 Encounter for other specified antenatal screening: Secondary | ICD-10-CM

## 2018-07-30 DIAGNOSIS — Z3A32 32 weeks gestation of pregnancy: Secondary | ICD-10-CM | POA: Insufficient documentation

## 2018-07-30 DIAGNOSIS — O2343 Unspecified infection of urinary tract in pregnancy, third trimester: Secondary | ICD-10-CM | POA: Diagnosis not present

## 2018-07-30 DIAGNOSIS — O26893 Other specified pregnancy related conditions, third trimester: Secondary | ICD-10-CM

## 2018-07-30 DIAGNOSIS — M7918 Myalgia, other site: Secondary | ICD-10-CM | POA: Diagnosis not present

## 2018-07-30 LAB — URINALYSIS, ROUTINE W REFLEX MICROSCOPIC
Bilirubin Urine: NEGATIVE
Glucose, UA: NEGATIVE mg/dL
Hgb urine dipstick: NEGATIVE
KETONES UR: NEGATIVE mg/dL
Nitrite: NEGATIVE
PH: 7 (ref 5.0–8.0)
Protein, ur: 30 mg/dL — AB
Specific Gravity, Urine: 1.021 (ref 1.005–1.030)

## 2018-07-30 LAB — WET PREP, GENITAL
Clue Cells Wet Prep HPF POC: NONE SEEN
Sperm: NONE SEEN
TRICH WET PREP: NONE SEEN
Yeast Wet Prep HPF POC: NONE SEEN

## 2018-07-30 MED ORDER — ACETAMINOPHEN 500 MG PO TABS
1000.0000 mg | ORAL_TABLET | Freq: Once | ORAL | Status: AC
  Administered 2018-07-30: 1000 mg via ORAL
  Filled 2018-07-30: qty 2

## 2018-07-30 MED ORDER — CEPHALEXIN 500 MG PO CAPS: 500.0000 mg | ORAL_CAPSULE | Freq: Four times a day (QID) | ORAL | 0 refills | Status: DC

## 2018-07-30 MED ORDER — ACETAMINOPHEN 325 MG PO TABS: 650.0000 mg | ORAL_TABLET | Freq: Four times a day (QID) | ORAL | 2 refills | Status: AC | PRN

## 2018-07-30 MED ORDER — TERCONAZOLE 0.8 % VA CREA: 1.0000 | TOPICAL_CREAM | Freq: Every day | VAGINAL | 0 refills | Status: DC

## 2018-07-30 NOTE — [facility] Note (Signed)
Sheri Nunez is a 17 y.o. at [id number] here in [facility] reporting: +lower abdominal pain. Constant. Sharp. Onset of complaint: started 3 days ago. Has not taken anything for the pain. Pain score: 6/10 +vaginal discharge. States its the normal white discharge but more than usual. Vitals:   07/30/18 1034  BP: 125/62  Pulse: 96  Resp: 16  Temp: 98.2 F (36.8 C)  SpO2: 100%     +FM Lab orders placed from triage: ua

## 2018-07-30 NOTE — MAU Provider Note (Signed)
History     CSN: [id number]  Arrival date and time: 07/30/18 1005   First Provider Initiated Contact with Patient 07/30/18 1122      Chief Complaint  Patient presents with  . Abdominal Pain  . Vaginal Discharge   HPI  Sheri Nunez is a 17 y.o. G1P0 at 3880w0d who presents to [facility]U with chief complaint of abdominal pain and vaginal discharge.   Abdominal pain This is a new problem, onset three days ago. Patient reports locus of pain is across the top of her abdomen. Rates as 4-7/10, does not radiate, no aggravating or alleviating factors. Patient states pain resolved as she was driving herself to MAU this morning but has recurred since being placed in a room and is now rated as 5/10.  Heavy vaginal discharge This is a new problem, onset within the past two days. Patient endorses thick white vaginal discharge. Does not itch, not foul-smelling. Most recent sexual intercourse in July 2019.   Denies vaginal bleeding, leaking of fluid, decreased fetal movement, fever, falls, or recent illness.   OB History    Gravida  1   Para      Term      Preterm      AB      Living        SAB      TAB      Ectopic      Multiple      Live Births              Past Medical History:  Diagnosis Date  . Medical history non-contributory     Past Surgical History:  Procedure Laterality Date  . NO PAST SURGERIES      No family history on file.  Social History   Tobacco Use  . Smoking status: Never Smoker  . Smokeless tobacco: Never Used  Substance Use Topics  . Alcohol use: No  . Drug use: No    Allergies: No Known Allergies  Medications Prior to Admission  Medication Sig Dispense Refill Last Dose  . Prenatal Vit-Fe Fumarate-FA (MULTIVITAMIN-PRENATAL) 27-0.8 MG TABS tablet Take 1 tablet by mouth daily at 12 noon. 180 each 0 Taking    Review of Systems  Constitutional: Negative for chills, fatigue and fever.  Gastrointestinal: Positive for abdominal pain.  Negative for diarrhea, nausea and vomiting.  Genitourinary: Positive for vaginal discharge. Negative for difficulty urinating, dysuria, vaginal bleeding and vaginal pain.  Musculoskeletal: Negative for back pain.  Neurological: Negative for dizziness.  All other systems reviewed and are negative.  Physical Exam   Blood pressure 125/62, pulse 96, temperature 98.2 F (36.8 C), temperature source Oral, resp. rate 16, weight 72.2 kg, last menstrual period 12/18/2017, SpO2 100 %.  Physical Exam  Nursing note and vitals reviewed. Constitutional: She is oriented to person, place, and time. She appears well-developed and well-nourished.  Cardiovascular: Normal rate.  Respiratory: Effort normal. No respiratory distress.  GI: There is no tenderness. There is no CVA tenderness.  Gravid  Genitourinary: Uterus normal. Vaginal discharge found.  Genitourinary Comments: Thick white creamy discharge on perineum and proximal to cervical os. Cervix visibly closed on SSE  Neurological: She is alert and oriented to person, place, and time.  Skin: Skin is warm and dry.  Psychiatric: She has a normal mood and affect. Her behavior is normal. Judgment and thought content normal.    MAU Course/MDM   --Negative wet prep, rods and hyphae visible on slide. Treat for yeast  infection --Negative Fern on slide --Reactive fetal tracing: baseline 140, moderate variability, positive accelerations, no decelerations --Toco: uterine irritability, occasional contractions not felt by patient, palpate mild --Pain effectively managed by Tylenol given in MAU. Patient denies pain at time of discharge  Results for orders placed or performed during the hospital encounter of 07/30/18 (from the past 24 hour(s))  Urinalysis, Routine w reflex microscopic     Status: Abnormal   Collection Time: 07/30/18 10:52 AM  Result Value Ref Range   Color, Urine YELLOW YELLOW   APPearance CLOUDY (A) CLEAR   Specific Gravity, Urine 1.021  1.005 - 1.030   pH 7.0 5.0 - 8.0   Glucose, UA NEGATIVE NEGATIVE mg/dL   Hgb urine dipstick NEGATIVE NEGATIVE   Bilirubin Urine NEGATIVE NEGATIVE   Ketones, ur NEGATIVE NEGATIVE mg/dL   Protein, ur 30 (A) NEGATIVE mg/dL   Nitrite NEGATIVE NEGATIVE   Leukocytes, UA SMALL (A) NEGATIVE   RBC / HPF 0-5 0 - 5 RBC/hpf   WBC, UA 11-20 0 - 5 WBC/hpf   Bacteria, UA MANY (A) NONE SEEN   Squamous Epithelial / LPF 11-20 0 - 5   Mucus PRESENT   Wet prep, genital     Status: Abnormal   Collection Time: 07/30/18 11:33 AM  Result Value Ref Range   Yeast Wet Prep HPF POC NONE SEEN NONE SEEN   Trich, Wet Prep NONE SEEN NONE SEEN   Clue Cells Wet Prep HPF POC NONE SEEN NONE SEEN   WBC, Wet Prep HPF POC MODERATE (A) NONE SEEN   Sperm NONE SEEN     Meds ordered this encounter  Medications  . acetaminophen (TYLENOL) tablet 1,000 mg  . acetaminophen (TYLENOL) 325 MG tablet    Sig: Take 2 tablets (650 mg total) by mouth every 6 (six) hours as needed for moderate pain.    Dispense:  100 tablet    Refill:  2    Order Specific Question:   Supervising Provider    Answer:   Reva Bores [[id number]]  . terconazole (TERAZOL 3) 0.8 % vaginal cream    Sig: Place 1 applicator vaginally at bedtime. Apply nightly for three nights.    Dispense:  20 g    Refill:  0    Order Specific Question:   Supervising Provider    Answer:   Reva Bores [[id number]]  . cephALEXin (KEFLEX) 500 MG capsule    Sig: Take 1 capsule (500 mg total) by mouth 4 (four) times daily for 10 days.    Dispense:  40 capsule    Refill:  0    Order Specific Question:   Supervising Provider    Answer:   Reva Bores [[id number]]    Assessment and Plan  --17 y.o. G1P0 at [redacted]w[redacted]d  --Reactive fetal tracing --UTI and yeast infection in pregnancy, rx to pharmacy of record --Musculoskeletal pain triggered by fetal positioning --Reviewed general obstetric precautions including but not limited to falls, fever, vaginal bleeding, leaking of fluid, decreased  fetal movement, headache not relieved by Tylenol, rest and PO hydration. --Discharge home in stable condition  F/U: Next OB appt 08/03/18  Calvert Cantor, CNM 07/30/2018, 1:53 PM

## 2018-07-30 NOTE — Discharge Instructions (Signed)

## 2018-07-31 LAB — CULTURE, OB URINE

## 2018-08-03 ENCOUNTER — Ambulatory Visit (INDEPENDENT_AMBULATORY_CARE_PROVIDER_SITE_OTHER): Payer: Medicaid Other | Admitting: Family Medicine

## 2018-08-03 ENCOUNTER — Other Ambulatory Visit: Payer: Self-pay

## 2018-08-03 VITALS — BP 120/58 | HR 109 | Temp 98.1°F | Wt 161.5 lb

## 2018-08-03 DIAGNOSIS — Z3403 Encounter for supervision of normal first pregnancy, third trimester: Secondary | ICD-10-CM

## 2018-08-03 NOTE — Progress Notes (Signed)
  Sheri Nunez is a 17 y.o. G1P0 at 959w4d here for routine follow up.  She reports positive FM, no LOF, no bleeding, no contractions. [facility]/ [facility]. Doing well, no vaginal discharge or UTI symptoms today. See flow sheet for details.  A/P: Pregnancy at 5159w4d.  Doing well.   Pregnancy issues include teen pregnancy, prior chlamydia infection treated w/ 2 TOC since then, hx UTI.  Infant feeding choice: beast Contraception choice: IUD Infant circumcision desired: not applicable  Tdap was not given today. Given 06/27/18  Preterm labor and fetal movement precautions reviewed. Safe sleep discussed. Follow up 2 weeks.  Dolores PattyAngela Delissa Silba, DO PGY-3, [facility] 08/03/2018 4:57 PM

## 2018-08-03 NOTE — Patient Instructions (Signed)
We'll see you in 2 weeks to follow up. If you have questions or concerns please do not hesitate to call at [phone]. Dolores Patty, DO PGY-3, [facility] 08/03/2018 2:40 PM   Third Trimester of Pregnancy The third trimester is from week 28 through week 40 (months 7 through 9). The third trimester is a time when the unborn baby (fetus) is growing rapidly. At the end of the ninth month, the fetus is about 20 inches in length and weighs 6-10 pounds. Body changes during your third trimester Your body will continue to go through many changes during pregnancy. The changes vary from woman to woman. During the third trimester:  Your weight will continue to increase. You can expect to gain 25-35 pounds (11-16 kg) by the end of the pregnancy.  You may begin to get stretch marks on your hips, abdomen, and breasts.  You may urinate more often because the fetus is moving lower into your pelvis and pressing on your bladder.  You may develop or continue to have heartburn. This is caused by increased hormones that slow down muscles in the digestive tract.  You may develop or continue to have constipation because increased hormones slow digestion and cause the muscles that push waste through your intestines to relax.  You may develop hemorrhoids. These are swollen veins (varicose veins) in the rectum that can itch or be painful.  You may develop swollen, bulging veins (varicose veins) in your legs.  You may have increased body aches in the pelvis, back, or thighs. This is due to weight gain and increased hormones that are relaxing your joints.  You may have changes in your hair. These can include thickening of your hair, rapid growth, and changes in texture. Some women also have hair loss during or after pregnancy, or hair that feels dry or thin. Your hair will most likely return to normal after your baby is born.  Your breasts will continue to grow and they will continue to become  tender. A yellow fluid (colostrum) may leak from your breasts. This is the first milk you are producing for your baby.  Your belly button may stick out.  You may notice more swelling in your hands, face, or ankles.  You may have increased tingling or numbness in your hands, arms, and legs. The skin on your belly may also feel numb.  You may feel short of breath because of your expanding uterus.  You may have more problems sleeping. This can be caused by the size of your belly, increased need to urinate, and an increase in your body's metabolism.  You may notice the fetus "dropping," or moving lower in your abdomen (lightening).  You may have increased vaginal discharge.  You may notice your joints feel loose and you may have pain around your pelvic bone.  What to expect at prenatal visits You will have prenatal exams every 2 weeks until week 36. Then you will have weekly prenatal exams. During a routine prenatal visit:  You will be weighed to make sure you and the baby are growing normally.  Your blood pressure will be taken.  Your abdomen will be measured to track your baby's growth.  The fetal heartbeat will be listened to.  Any test results from the previous visit will be discussed.  You may have a cervical check near your due date to see if your cervix has softened or thinned (effaced).  You will be tested for Group B streptococcus. This happens between  35 and 37 weeks.  Your health care provider may ask you:  What your birth plan is.  How you are feeling.  If you are feeling the baby move.  If you have had any abnormal symptoms, such as leaking fluid, bleeding, severe headaches, or abdominal cramping.  If you are using any tobacco products, including cigarettes, chewing tobacco, and electronic cigarettes.  If you have any questions.  Other tests or screenings that may be performed during your third trimester include:  Blood tests that check for low iron levels  (anemia).  Fetal testing to check the health, activity level, and growth of the fetus. Testing is done if you have certain medical conditions or if there are problems during the pregnancy.  Nonstress test (NST). This test checks the health of your baby to make sure there are no signs of problems, such as the baby not getting enough oxygen. During this test, a belt is placed around your belly. The baby is made to move, and its heart rate is monitored during movement.  What is false labor? False labor is a condition in which you feel small, irregular tightenings of the muscles in the womb (contractions) that usually go away with rest, changing position, or drinking water. These are called Braxton Hicks contractions. Contractions may last for hours, days, or even weeks before true labor sets in. If contractions come at regular intervals, become more frequent, increase in intensity, or become painful, you should see your health care provider. What are the signs of labor?  Abdominal cramps.  Regular contractions that start at 10 minutes apart and become stronger and more frequent with time.  Contractions that start on the top of the uterus and spread down to the lower abdomen and back.  Increased pelvic pressure and dull back pain.  A watery or bloody mucus discharge that comes from the vagina.  Leaking of amniotic fluid. This is also known as your "water breaking." It could be a slow trickle or a gush. Let your health care provider know if it has a color or strange odor. If you have any of these signs, call your health care provider right away, even if it is before your due date. Follow these instructions at home: Medicines  Follow your health care provider's instructions regarding medicine use. Specific medicines may be either safe or unsafe to take during pregnancy.  Take a prenatal vitamin that contains at least 600 micrograms (mcg) of folic acid.  If you develop constipation, try taking  a stool softener if your health care provider approves. Eating and drinking  Eat a balanced diet that includes fresh fruits and vegetables, whole grains, good sources of protein such as meat, eggs, or tofu, and low-fat dairy. Your health care provider will help you determine the amount of weight gain that is right for you.  Avoid raw meat and uncooked cheese. These carry germs that can cause birth defects in the baby.  If you have low calcium intake from food, talk to your health care provider about whether you should take a daily calcium supplement.  Eat four or five small meals rather than three large meals a day.  Limit foods that are high in fat and processed sugars, such as fried and sweet foods.  To prevent constipation: ? Drink enough fluid to keep your urine clear or pale yellow. ? Eat foods that are high in fiber, such as fresh fruits and vegetables, whole grains, and beans. Activity  Exercise only as directed by  your health care provider. Most women can continue their usual exercise routine during pregnancy. Try to exercise for 30 minutes at least 5 days a week. Stop exercising if you experience uterine contractions.  Avoid heavy lifting.  Do not exercise in extreme heat or humidity, or at high altitudes.  Wear low-heel, comfortable shoes.  Practice good posture.  You may continue to have sex unless your health care provider tells you otherwise. Relieving pain and discomfort  Take frequent breaks and rest with your legs elevated if you have leg cramps or low back pain.  Take warm sitz baths to soothe any pain or discomfort caused by hemorrhoids. Use hemorrhoid cream if your health care provider approves.  Wear a good support bra to prevent discomfort from breast tenderness.  If you develop varicose veins: ? Wear support pantyhose or compression stockings as told by your healthcare provider. ? Elevate your feet for 15 minutes, 3-4 times a day. Prenatal care  Write  down your questions. Take them to your prenatal visits.  Keep all your prenatal visits as told by your health care provider. This is important. Safety  Wear your seat belt at all times when driving.  Make a list of emergency phone numbers, including numbers for family, friends, the hospital, and police and fire departments. General instructions  Avoid cat litter boxes and soil used by cats. These carry germs that can cause birth defects in the baby. If you have a cat, ask someone to clean the litter box for you.  Do not travel far distances unless it is absolutely necessary and only with the approval of your health care provider.  Do not use hot tubs, steam rooms, or saunas.  Do not drink alcohol.  Do not use any products that contain nicotine or tobacco, such as cigarettes and e-cigarettes. If you need help quitting, ask your health care provider.  Do not use any medicinal herbs or unprescribed drugs. These chemicals affect the formation and growth of the baby.  Do not douche or use tampons or scented sanitary pads.  Do not cross your legs for long periods of time.  To prepare for the arrival of your baby: ? Take prenatal classes to understand, practice, and ask questions about labor and delivery. ? Make a trial run to the hospital. ? Visit the hospital and tour the maternity area. ? Arrange for maternity or paternity leave through employers. ? Arrange for family and friends to take care of pets while you are in the hospital. ? Purchase a rear-facing car seat and make sure you know how to install it in your car. ? Pack your hospital bag. ? Prepare the baby's nursery. Make sure to remove all pillows and stuffed animals from the baby's crib to prevent suffocation.  Visit your dentist if you have not gone during your pregnancy. Use a soft toothbrush to brush your teeth and be gentle when you floss. Contact a health care provider if:  You are unsure if you are in labor or if your  water has broken.  You become dizzy.  You have mild pelvic cramps, pelvic pressure, or nagging pain in your abdominal area.  You have lower back pain.  You have persistent nausea, vomiting, or diarrhea.  You have an unusual or bad smelling vaginal discharge.  You have pain when you urinate. Get help right away if:  Your water breaks before 37 weeks.  You have regular contractions less than 5 minutes apart before 37 weeks.  You have  a fever.  You are leaking fluid from your vagina.  You have spotting or bleeding from your vagina.  You have severe abdominal pain or cramping.  You have rapid weight loss or weight gain.  You have shortness of breath with chest pain.  You notice sudden or extreme swelling of your face, hands, ankles, feet, or legs.  Your baby makes fewer than 10 movements in 2 hours.  You have severe headaches that do not go away when you take medicine.  You have vision changes. Summary  The third trimester is from week 28 through week 40, months 7 through 9. The third trimester is a time when the unborn baby (fetus) is growing rapidly.  During the third trimester, your discomfort may increase as you and your baby continue to gain weight. You may have abdominal, leg, and back pain, sleeping problems, and an increased need to urinate.  During the third trimester your breasts will keep growing and they will continue to become tender. A yellow fluid (colostrum) may leak from your breasts. This is the first milk you are producing for your baby.  False labor is a condition in which you feel small, irregular tightenings of the muscles in the womb (contractions) that eventually go away. These are called Braxton Hicks contractions. Contractions may last for hours, days, or even weeks before true labor sets in.  Signs of labor can include: abdominal cramps; regular contractions that start at 10 minutes apart and become stronger and more frequent with time; watery or  bloody mucus discharge that comes from the vagina; increased pelvic pressure and dull back pain; and leaking of amniotic fluid. This information is not intended to replace advice given to you by your health care provider. Make sure you discuss any questions you have with your health care provider. Document Released: 08/25/2001 Document Revised: 02/06/2016 Document Reviewed: 11/01/2012 Elsevier Interactive Patient Education  2017 [employer]

## 2018-08-15 ENCOUNTER — Encounter (HOSPITAL_COMMUNITY): Payer: Self-pay | Admitting: *Deleted

## 2018-08-15 ENCOUNTER — Inpatient Hospital Stay (HOSPITAL_COMMUNITY)
Admission: AD | Admit: 2018-08-15 | Discharge: 2018-08-15 | Disposition: A | Discharge status: Home / Self Care | Payer: Medicaid Other | Source: Ambulatory Visit | Attending: Obstetrics and Gynecology | Admitting: Obstetrics and Gynecology

## 2018-08-15 DIAGNOSIS — O26893 Other specified pregnancy related conditions, third trimester: Secondary | ICD-10-CM | POA: Insufficient documentation

## 2018-08-15 DIAGNOSIS — N898 Other specified noninflammatory disorders of vagina: Secondary | ICD-10-CM | POA: Diagnosis not present

## 2018-08-15 DIAGNOSIS — Z3A34 34 weeks gestation of pregnancy: Secondary | ICD-10-CM | POA: Insufficient documentation

## 2018-08-15 HISTORY — DX: Unspecified infectious disease: B99.9

## 2018-08-15 LAB — WET PREP, GENITAL
CLUE CELLS WET PREP: NONE SEEN
Sperm: NONE SEEN
Trich, Wet Prep: NONE SEEN
Yeast Wet Prep HPF POC: NONE SEEN

## 2018-08-15 NOTE — Clinical Social Work Note (Signed)
Pt reports receiving pnc at family medicine and interested in PP IUD. Pt reports she has necessary essentials for newborn arrivals and does not have any safety concerns. Pt received family planning counseling in MAU.

## 2018-08-15 NOTE — Discharge Instructions (Signed)

## 2018-08-15 NOTE — MAU Note (Signed)
Was here a couple wks ago for yeast infection.  Was treated.   Is still seeing d/c and smelling it, she thinks she has bv

## 2018-08-15 NOTE — MAU Provider Note (Signed)
Chief Complaint:  Vaginal Discharge   First Provider Initiated Contact with Patient 08/15/18 1344     HPI: Sheri Nunez is a 17 y.o. G1P0 at 6430w2d who presents to maternity admissions reporting vaginal discharge. Was diagnosed with yeast infection a few weeks ago. Completed treatment. States continues to have vaginal discharge. Reports white thick discharge with foul odor at times. Denies itching or irritation. States she feels like she has BV.  Denies contractions, leakage of fluid or vaginal bleeding. Good fetal movement.    Past Medical History:  Diagnosis Date  . Infection    UTI  . Medical history non-contributory    OB History  Gravida Para Term Preterm AB Living  1            SAB TAB Ectopic Multiple Live Births               # Outcome Date GA Lbr Len/2nd Weight Sex Delivery Anes PTL Lv  1 Current            Past Surgical History:  Procedure Laterality Date  . NO PAST SURGERIES     Family History  Problem Relation Age of Onset  . Healthy Mother   . Healthy Father    Social History   Tobacco Use  . Smoking status: Never Smoker  . Smokeless tobacco: Never Used  Substance Use Topics  . Alcohol use: No  . Drug use: No   No Known Allergies No medications prior to admission.    I have reviewed patient's Past Medical Hx, Surgical Hx, Family Hx, Social Hx, medications and allergies.   ROS:  Review of Systems  Constitutional: Negative.   Gastrointestinal: Negative.   Genitourinary: Positive for vaginal discharge.    Physical Exam   Patient Vitals for the past 24 hrs:  BP Temp Temp src Pulse Resp SpO2 Weight  08/15/18 1457 114/83 - - (!) 114 18 - -  08/15/18 1242 123/66 98 F (36.7 C) Oral (!) 105 16 100 % 75.4 kg    Constitutional: Well-developed, well-nourished female in no acute distress.  Cardiovascular: normal rate & rhythm, no murmur Respiratory: normal effort, lung sounds clear throughout GI: Abd soft, non-tender, gravid appropriate for  gestational age. Pos BS x 4 MS: Extremities nontender, no edema, normal ROM Neurologic: Alert and oriented x 4.  GU:   Minimal amount of creamy white discharge. No pooling of fluid. Cervix visually closed.   NST:  Baseline: 140 bpm, Variability: Good {> 6 bpm), Accelerations: Reactive and Decelerations: Absent   Labs: Results for orders placed or performed during the hospital encounter of 08/15/18 (from the past 24 hour(s))  Wet prep, genital     Status: Abnormal   Collection Time: 08/15/18  2:04 PM  Result Value Ref Range   Yeast Wet Prep HPF POC NONE SEEN NONE SEEN   Trich, Wet Prep NONE SEEN NONE SEEN   Clue Cells Wet Prep HPF POC NONE SEEN NONE SEEN   WBC, Wet Prep HPF POC MODERATE (A) NONE SEEN   Sperm NONE SEEN     Imaging:  No results found.  MAU Course: Orders Placed This Encounter  Procedures  . Wet prep, genital  . Discharge patient   No orders of the defined types were placed in this encounter.   MDM: No abnormal discharge on exam. Wet prep collected & normal.  Pt declines GC/CT testing. Reports no intercourse since last negative results last month.   Assessment: 1. Vaginal discharge during pregnancy  in third trimester   2. [redacted] weeks gestation of pregnancy     Plan: Discharge home in stable condition.  Preterm Labor precautions and fetal kick counts   Allergies as of 08/15/2018   No Known Allergies     Medication List    STOP taking these medications   terconazole 0.8 % vaginal cream Commonly known as:  TERAZOL 3     TAKE these medications   acetaminophen 325 MG tablet Commonly known as:  TYLENOL Take 2 tablets (650 mg total) by mouth every 6 (six) hours as needed for moderate pain.   multivitamin-prenatal 27-0.8 MG Tabs tablet Take 1 tablet by mouth daily at 12 noon.       Judeth Horn, NP 08/15/2018 4:31 PM

## 2018-08-19 ENCOUNTER — Ambulatory Visit (INDEPENDENT_AMBULATORY_CARE_PROVIDER_SITE_OTHER): Payer: Medicaid Other | Admitting: Family Medicine

## 2018-08-19 VITALS — BP 120/60 | HR 96 | Temp 98.2°F | Wt 168.0 lb

## 2018-08-19 DIAGNOSIS — Z3403 Encounter for supervision of normal first pregnancy, third trimester: Secondary | ICD-10-CM | POA: Diagnosis not present

## 2018-08-19 DIAGNOSIS — Z3493 Encounter for supervision of normal pregnancy, unspecified, third trimester: Secondary | ICD-10-CM

## 2018-08-19 NOTE — Patient Instructions (Addendum)
 It was wonderful to see you today.  Thank you for choosing [facility].   Please call [phone] with any questions about today's appointment.  Please be sure to schedule follow up at the front  desk before you leave today.   Bryson Gavia, MD  Family Medicine    Third Trimester of Pregnancy The third trimester is from week 29 through week 42, months 7 through 9. This trimester is when your unborn baby (fetus) is growing very fast. At the end of the ninth month, the unborn baby is about 20 inches in length. It weighs about 6-10 pounds. Follow these instructions at home:  Avoid all smoking, herbs, and alcohol. Avoid drugs not approved by your doctor.  Do not use any tobacco products, including cigarettes, chewing tobacco, and electronic cigarettes. If you need help quitting, ask your doctor. You may get counseling or other support to help you quit.  Only take medicine as told by your doctor. Some medicines are safe and some are not during pregnancy.  Exercise only as told by your doctor. Stop exercising if you start having cramps.  Eat regular, healthy meals.  Wear a good support bra if your breasts are tender.  Do not use hot tubs, steam rooms, or saunas.  Wear your seat belt when driving.  Avoid raw meat, uncooked cheese, and liter boxes and soil used by cats.  Take your prenatal vitamins.  Take 1500-2000 milligrams of calcium daily starting at the 20th week of pregnancy until you deliver your baby.  Try taking medicine that helps you poop (stool softener) as needed, and if your doctor approves. Eat more fiber by eating fresh fruit, vegetables, and whole grains. Drink enough fluids to keep your pee (urine) clear or pale yellow.  Take warm water baths (sitz baths) to soothe pain or discomfort caused by hemorrhoids. Use hemorrhoid cream if your doctor approves.  If you have puffy, bulging veins (varicose veins), wear support hose. Raise (elevate) your  feet for 15 minutes, 3-4 times a day. Limit salt in your diet.  Avoid heavy lifting, wear low heels, and sit up straight.  Rest with your legs raised if you have leg cramps or low back pain.  Visit your dentist if you have not gone during your pregnancy. Use a soft toothbrush to brush your teeth. Be gentle when you floss.  You can have sex (intercourse) unless your doctor tells you not to.  Do not travel far distances unless you must. Only do so with your doctor's approval.  Take prenatal classes.  Practice driving to the hospital.  Pack your hospital bag.  Prepare the baby's room.  Go to your doctor visits. Get help if:  You are not sure if you are in labor or if your water has broken.  You are dizzy.  You have mild cramps or pressure in your lower belly (abdominal).  You have a nagging pain in your belly area.  You continue to feel sick to your stomach (nauseous), throw up (vomit), or have watery poop (diarrhea).  You have bad smelling fluid coming from your vagina.  You have pain with peeing (urination). Get help right away if:  You have a fever.  You are leaking fluid from your vagina.  You are spotting or bleeding from your vagina.  You have severe belly cramping or pain.  You lose or gain weight rapidly.  You have trouble catching your breath and have chest pain.  You notice sudden or extreme   puffiness (swelling) of your face, hands, ankles, feet, or legs.  You have not felt the baby move in over an hour.  You have severe headaches that do not go away with medicine.  You have vision changes. This information is not intended to replace advice given to you by your health care provider. Make sure you discuss any questions you have with your health care provider. Document Released: 11/25/2009 Document Revised: 02/06/2016 Document Reviewed: 11/01/2012 Elsevier Interactive Patient Education  2017 Elsevier Inc.  

## 2018-08-19 NOTE — Progress Notes (Signed)
  Patient Name: [name] Date of Birth: 03/03/2001 Date of Visit: 08/19/18 PCP: Tillman Sersiccio, Angela C, DO  Chief Complaint: prenatal care  Subjective: Sheri Nunez is a pleasant G1P0 at  4033w6d  dated by LMP consistent with 7 week ultrasound. She has no unusual complaints today. Reports good fetal movement. Denies contractions, loss of fluid, or bleeding.    ROS:  ROS Negative as above.   I have reviewed the patient's medical, surgical, family, and social history as appropriate.   Pertinent PMH: history of UTI in past  Prior Obstetric History: none  Complications of Current Pregnancy:Anemia affecting pregnancy    Vitals:   08/19/18 0853  BP: 120/60  Pulse: 96  Temp: 98.2 F (36.8 C)   Last Weight  Most recent update: 08/19/2018  8:54 AM   Weight  76.2 kg (168 lb)           Prepregnancy weigh 150 lbs  Filed Weights   08/19/18 0853  Weight: 168 lb (76.2 kg)   FH 34 cm FHT 145 bpm  Vertex on ultrasound today   Sheri Nunez was seen today for routine prenatal visit.  Diagnoses and all orders for this visit:  Encounter for supervision of normal first pregnancy in third trimester -     CBC--- normal hemoglobinopathy  -     Consider addition of ferritin  - Decided on name of Sheri Nunez - Has crib and carseat    Anticipatory Guidance and Prenatal Education provided on the following topics: - Preterm labor signs  - Reasons to present to MAU - Nutrition in pregnancy - Contraception postpartum - Breastfeed - Safe sleep for infant   Sheri Starrarina Koreen Lizaola, MD  [facility] Teaching [facility]

## 2018-08-20 LAB — CBC
Hematocrit: 30.5 % — ABNORMAL LOW (ref 34.0–46.6)
Hemoglobin: 10.3 g/dL — ABNORMAL LOW (ref 11.1–15.9)
MCH: 29.9 pg (ref 26.6–33.0)
MCHC: 33.8 g/dL (ref 31.5–35.7)
MCV: 88 fL (ref 79–97)
PLATELETS: 367 10*3/uL (ref 150–450)
RBC: 3.45 x10E6/uL — AB (ref 3.77–5.28)
RDW: 12.5 % (ref 12.3–15.4)
WBC: 7.8 10*3/uL (ref 3.4–10.8)

## 2018-08-30 ENCOUNTER — Telehealth: Payer: Self-pay | Admitting: Family Medicine

## 2018-08-30 DIAGNOSIS — O99013 Anemia complicating pregnancy, third trimester: Secondary | ICD-10-CM

## 2018-08-30 MED ORDER — FERROUS SULFATE 325 (65 FE) MG PO TABS: 325.0000 mg | ORAL_TABLET | Freq: Every day | ORAL | 3 refills | Status: DC

## 2018-08-30 NOTE — Telephone Encounter (Signed)
Called with results-- prior hemoglobinopathy screen normal. Started iron. Patient reports she is doing well- excited for upcoming appointment. Reviewed side effects of medication including constipation and change in color of stools.   Terisa Starrarina Candace Ramus, MD  Family Medicine Teaching Service

## 2018-09-05 ENCOUNTER — Other Ambulatory Visit (HOSPITAL_COMMUNITY)
Admission: RE | Admit: 2018-09-05 | Discharge: 2018-09-05 | Disposition: A | Discharge status: Home / Self Care | Payer: Medicaid Other | Source: Ambulatory Visit | Attending: Family Medicine | Admitting: Family Medicine

## 2018-09-05 ENCOUNTER — Other Ambulatory Visit: Payer: Self-pay

## 2018-09-05 ENCOUNTER — Ambulatory Visit (INDEPENDENT_AMBULATORY_CARE_PROVIDER_SITE_OTHER): Payer: Medicaid Other | Admitting: Family Medicine

## 2018-09-05 VITALS — BP 112/62 | HR 117 | Temp 98.4°F | Wt 175.4 lb

## 2018-09-05 DIAGNOSIS — R300 Dysuria: Secondary | ICD-10-CM | POA: Diagnosis not present

## 2018-09-05 DIAGNOSIS — Z3403 Encounter for supervision of normal first pregnancy, third trimester: Secondary | ICD-10-CM | POA: Diagnosis not present

## 2018-09-05 LAB — POCT URINALYSIS DIP (MANUAL ENTRY)
BILIRUBIN UA: NEGATIVE mg/dL
Bilirubin, UA: NEGATIVE
Glucose, UA: NEGATIVE mg/dL
NITRITE UA: NEGATIVE
PH UA: 7 (ref 5.0–8.0)
PROTEIN UA: NEGATIVE mg/dL
RBC UA: NEGATIVE
Spec Grav, UA: 1.02 (ref 1.010–1.025)
Urobilinogen, UA: 0.2 E.U./dL

## 2018-09-05 LAB — POCT UA - MICROSCOPIC ONLY

## 2018-09-05 NOTE — Patient Instructions (Addendum)
Thank you for coming to see me today. It was a pleasure! Today we talked about:   If you have any contractions which occur 7-10 minutes apart, vaginal bleeding, fluid leaking, or are worried that baby is not moving well, [facility] immediately [facility] to be [facility].  Please follow-up in 1 week for prenatal care or sooner as needed.  If you have any questions or concerns, please do not hesitate to call the office at [phone].  Take Care,   Sheri Lum Stillinger, DO   Third Trimester of Pregnancy  The third trimester is from week 28 through week 40 (months 7 through 9). This trimester is when your unborn baby (fetus) is growing very fast. At the end of the ninth month, the unborn baby is about 20 inches in length. It weighs about 6-10 pounds. Follow these instructions at home: Medicines  Take over-the-counter and prescription medicines only as told by your doctor. Some medicines are safe and some medicines are not safe during pregnancy.  Take a prenatal vitamin that contains at least 600 micrograms (mcg) of folic acid.  If you have trouble pooping (constipation), take medicine that will make your stool soft (stool softener) if your doctor approves. Eating and drinking   Eat regular, healthy meals.  Avoid raw meat and uncooked cheese.  If you get low calcium from the food you eat, talk to your doctor about taking a daily calcium supplement.  Eat four or five small meals rather than three large meals a day.  Avoid foods that are high in fat and sugars, such as fried and sweet foods.  To prevent constipation: ? Eat foods that are high in fiber, like fresh fruits and vegetables, whole grains, and beans. ? Drink enough fluids to keep your pee (urine) clear or pale yellow. Activity  Exercise only as told by your doctor. Stop exercising if you start to have cramps.  Avoid heavy lifting, wear low heels, and sit up straight.  Do not exercise if it is too hot, too humid, or if  you are in a place of great height (high altitude).  You may continue to have sex unless your doctor tells you not to. Relieving pain and discomfort  Wear a good support bra if your breasts are tender.  Take frequent breaks and rest with your legs raised if you have leg cramps or low back pain.  Take warm water baths (sitz baths) to soothe pain or discomfort caused by hemorrhoids. Use hemorrhoid cream if your doctor approves.  If you develop puffy, bulging veins (varicose veins) in your legs: ? Wear support hose or compression stockings as told by your doctor. ? Raise (elevate) your feet for 15 minutes, 3-4 times a day. ? Limit salt in your food. Safety  Wear your seat belt when driving.  Make a list of emergency phone numbers, including numbers for family, friends, the hospital, and police and fire departments. Preparing for your baby's arrival To prepare for the arrival of your baby:  Take prenatal classes.  Practice driving to the hospital.  Visit the hospital and tour the maternity area.  Talk to your work about taking leave once the baby comes.  Pack your hospital bag.  Prepare the baby's room.  Go to your doctor visits.  Buy a rear-facing car seat. Learn how to install it in your car. General instructions  Do not use hot tubs, steam rooms, or saunas.  Do not use any products that contain nicotine or tobacco, such  as cigarettes and e-cigarettes. If you need help quitting, ask your doctor.  Do not drink alcohol.  Do not douche or use tampons or scented sanitary pads.  Do not cross your legs for long periods of time.  Do not travel for long distances unless you must. Only do so if your doctor says it is okay.  Visit your dentist if you have not gone during your pregnancy. Use a soft toothbrush to brush your teeth. Be gentle when you floss.  Avoid cat litter boxes and soil used by cats. These carry germs that can cause birth defects in the baby and can cause a  loss of your baby (miscarriage) or stillbirth.  Keep all your prenatal visits as told by your doctor. This is important. Contact a doctor if:  You are not sure if you are in labor or if your water has broken.  You are dizzy.  You have mild cramps or pressure in your lower belly.  You have a nagging pain in your belly area.  You continue to feel sick to your stomach, you throw up, or you have watery poop.  You have bad smelling fluid coming from your vagina.  You have pain when you pee. Get help right away if:  You have a fever.  You are leaking fluid from your vagina.  You are spotting or bleeding from your vagina.  You have severe belly cramps or pain.  You lose or gain weight quickly.  You have trouble catching your breath and have chest pain.  You notice sudden or extreme puffiness (swelling) of your face, hands, ankles, feet, or legs.  You have not felt the baby move in over an hour.  You have severe headaches that do not go away with medicine.  You have trouble seeing.  You are leaking, or you are having a gush of fluid, from your vagina before you are 37 weeks.  You have regular belly spasms (contractions) before you are 37 weeks. Summary  The third trimester is from week 28 through week 40 (months 7 through 9). This time is when your unborn baby is growing very fast.  Follow your doctor's advice about medicine, food, and activity.  Get ready for the arrival of your baby by taking prenatal classes, getting all the baby items ready, preparing the baby's room, and visiting your doctor to be checked.  Get help right away if you are bleeding from your vagina, or you have chest pain and trouble catching your breath, or if you have not felt your baby move in over an hour. This information is not intended to replace advice given to you by your health care provider. Make sure you discuss any questions you have with your health care provider. Document Released:  11/25/2009 Document Revised: 10/06/2016 Document Reviewed: 10/06/2016 Elsevier Interactive Patient Education  2019 [employer]

## 2018-09-05 NOTE — Progress Notes (Signed)
Sheri Nunez is a 17 y.o. G1P0 at 5855w2d for routine follow up.  Reports good fetal movement. Denies contractions, loss of fluid, or bleeding.    See flow sheet for details. Today's Vitals   09/05/18 0842  BP: 112/62  Pulse: (!) 117  Temp: 98.4 F (36.9 C)  Weight: 175 lb 6.4 oz (79.6 kg)   Body mass index is 26.67 kg/m. Repeat Pulse 101  A/P: Pregnancy at 6555w2d.  Doing well.   Pregnancy issues include: recently started on iron supplementation, doing well. Patient with some dysuria noted, will obtain UA with culture.   Infant feeding choice: breast Contraception choice: IUD Infant circumcision desired not applicable GBS/GC/CZ collected today.  Anticipatory Guidance and Prenatal Education provided on the following topics: - Preterm labor signs  - Reasons to present to MAU - Nutrition in pregnancy - Contraception postpartum - Breastfeed - Safe sleep for infant   [place]il[place] Ethlyn Alto, DO PGY-2, [facility]

## 2018-09-05 NOTE — Addendum Note (Signed)
Addended by: Georges LynchSAUNDERS, Rozanne Heumann T on: 09/05/2018 09:13 AM   Modules accepted: Orders

## 2018-09-05 NOTE — Addendum Note (Signed)
Addended by: Jennette BillBUSICK, ROBERT L on: 09/05/2018 09:25 AM   Modules accepted: Orders

## 2018-09-06 LAB — CERVICOVAGINAL ANCILLARY ONLY
CHLAMYDIA, DNA PROBE: NEGATIVE
Neisseria Gonorrhea: NEGATIVE

## 2018-09-08 LAB — URINE CULTURE, OB REFLEX

## 2018-09-08 LAB — CULTURE, OB URINE

## 2018-09-09 LAB — CULTURE, BETA STREP (GROUP B ONLY): Strep Gp B Culture: NEGATIVE

## 2018-09-10 ENCOUNTER — Inpatient Hospital Stay (HOSPITAL_COMMUNITY): Payer: Medicaid Other | Admitting: Anesthesiology

## 2018-09-10 ENCOUNTER — Encounter (HOSPITAL_COMMUNITY): Payer: Self-pay

## 2018-09-10 ENCOUNTER — Inpatient Hospital Stay (HOSPITAL_COMMUNITY)
Admission: AD | Admit: 2018-09-10 | Discharge: 2018-09-12 | DRG: 807 | Disposition: A | Discharge status: Home / Self Care | Payer: Medicaid Other | Attending: Obstetrics & Gynecology | Admitting: Obstetrics & Gynecology

## 2018-09-10 ENCOUNTER — Other Ambulatory Visit: Payer: Self-pay

## 2018-09-10 DIAGNOSIS — O9902 Anemia complicating childbirth: Principal | ICD-10-CM | POA: Diagnosis present

## 2018-09-10 DIAGNOSIS — Z3A38 38 weeks gestation of pregnancy: Secondary | ICD-10-CM

## 2018-09-10 DIAGNOSIS — D649 Anemia, unspecified: Secondary | ICD-10-CM | POA: Diagnosis present

## 2018-09-10 DIAGNOSIS — Z9189 Other specified personal risk factors, not elsewhere classified: Secondary | ICD-10-CM

## 2018-09-10 DIAGNOSIS — Z3400 Encounter for supervision of normal first pregnancy, unspecified trimester: Secondary | ICD-10-CM

## 2018-09-10 DIAGNOSIS — Z3483 Encounter for supervision of other normal pregnancy, third trimester: Secondary | ICD-10-CM | POA: Diagnosis present

## 2018-09-10 LAB — CBC
HCT: 33.1 % — ABNORMAL LOW (ref 36.0–46.0)
Hemoglobin: 10.9 g/dL — ABNORMAL LOW (ref 12.0–15.0)
MCH: 30.6 pg (ref 26.0–34.0)
MCHC: 32.9 g/dL (ref 30.0–36.0)
MCV: 93 fL (ref 80.0–100.0)
Platelets: 368 10*3/uL (ref 150–400)
RBC: 3.56 MIL/uL — ABNORMAL LOW (ref 3.87–5.11)
RDW: 14.9 % (ref 11.5–15.5)
WBC: 10.4 10*3/uL (ref 4.0–10.5)
nRBC: 0 % (ref 0.0–0.2)

## 2018-09-10 LAB — TYPE AND SCREEN
ABO/RH(D): O POS
Antibody Screen: NEGATIVE

## 2018-09-10 LAB — ABO/RH: ABO/RH(D): O POS

## 2018-09-10 MED ORDER — EPHEDRINE 5 MG/ML INJ
10.0000 mg | INTRAVENOUS | Status: DC | PRN
  Filled 2018-09-10: qty 2

## 2018-09-10 MED ORDER — OXYCODONE HCL 5 MG PO TABS
5.0000 mg | ORAL_TABLET | Freq: Once | ORAL | Status: AC
  Administered 2018-09-10: 5 mg via ORAL
  Filled 2018-09-10: qty 1

## 2018-09-10 MED ORDER — OXYCODONE-ACETAMINOPHEN 5-325 MG PO TABS: 1.0000 | ORAL_TABLET | ORAL | Status: DC | PRN

## 2018-09-10 MED ORDER — ONDANSETRON HCL 4 MG/2ML IJ SOLN: 4.0000 mg | Freq: Four times a day (QID) | INTRAMUSCULAR | Status: DC | PRN

## 2018-09-10 MED ORDER — TETANUS-DIPHTH-ACELL PERTUSSIS 5-2.5-18.5 LF-MCG/0.5 IM SUSP: 0.5000 mL | Freq: Once | INTRAMUSCULAR | Status: DC

## 2018-09-10 MED ORDER — LIDOCAINE HCL (PF) 1 % IJ SOLN
INTRAMUSCULAR | Status: DC | PRN
  Administered 2018-09-10 (×2): 5 mL via EPIDURAL

## 2018-09-10 MED ORDER — LACTATED RINGERS IV SOLN
INTRAVENOUS | Status: DC
  Administered 2018-09-10: 125 mL/h via INTRAVENOUS
  Administered 2018-09-10: 05:00:00 via INTRAVENOUS

## 2018-09-10 MED ORDER — FENTANYL 2.5 MCG/ML BUPIVACAINE 1/10 % EPIDURAL INFUSION (WH - ANES)
INTRAMUSCULAR | Status: AC
  Filled 2018-09-10: qty 100

## 2018-09-10 MED ORDER — PHENYLEPHRINE 40 MCG/ML (10ML) SYRINGE FOR IV PUSH (FOR BLOOD PRESSURE SUPPORT)
80.0000 ug | PREFILLED_SYRINGE | INTRAVENOUS | Status: DC | PRN
  Filled 2018-09-10: qty 10

## 2018-09-10 MED ORDER — DIPHENHYDRAMINE HCL 50 MG/ML IJ SOLN: 12.5000 mg | INTRAMUSCULAR | Status: DC | PRN

## 2018-09-10 MED ORDER — DIPHENHYDRAMINE HCL 25 MG PO CAPS: 25.0000 mg | ORAL_CAPSULE | Freq: Four times a day (QID) | ORAL | Status: DC | PRN

## 2018-09-10 MED ORDER — WITCH HAZEL-GLYCERIN EX PADS: 1.0000 "application " | MEDICATED_PAD | CUTANEOUS | Status: DC | PRN

## 2018-09-10 MED ORDER — OXYTOCIN BOLUS FROM INFUSION
500.0000 mL | Freq: Once | INTRAVENOUS | Status: AC
  Administered 2018-09-10: 500 mL via INTRAVENOUS

## 2018-09-10 MED ORDER — ACETAMINOPHEN 325 MG PO TABS: 650.0000 mg | ORAL_TABLET | ORAL | Status: DC | PRN

## 2018-09-10 MED ORDER — LACTATED RINGERS IV SOLN: 500.0000 mL | INTRAVENOUS | Status: DC | PRN

## 2018-09-10 MED ORDER — ONDANSETRON HCL 4 MG PO TABS: 4.0000 mg | ORAL_TABLET | ORAL | Status: DC | PRN

## 2018-09-10 MED ORDER — SOD CITRATE-CITRIC ACID 500-334 MG/5ML PO SOLN: 30.0000 mL | ORAL | Status: DC | PRN

## 2018-09-10 MED ORDER — OXYCODONE-ACETAMINOPHEN 5-325 MG PO TABS: 2.0000 | ORAL_TABLET | ORAL | Status: DC | PRN

## 2018-09-10 MED ORDER — ZOLPIDEM TARTRATE 5 MG PO TABS: 5.0000 mg | ORAL_TABLET | Freq: Every evening | ORAL | Status: DC | PRN

## 2018-09-10 MED ORDER — SIMETHICONE 80 MG PO CHEW: 80.0000 mg | CHEWABLE_TABLET | ORAL | Status: DC | PRN

## 2018-09-10 MED ORDER — BENZOCAINE-MENTHOL 20-0.5 % EX AERO
1.0000 "application " | INHALATION_SPRAY | CUTANEOUS | Status: DC | PRN
  Administered 2018-09-10: 1 via TOPICAL
  Filled 2018-09-10: qty 56

## 2018-09-10 MED ORDER — FENTANYL CITRATE (PF) 100 MCG/2ML IJ SOLN
100.0000 ug | INTRAMUSCULAR | Status: DC | PRN
  Administered 2018-09-10: 100 ug via INTRAVENOUS

## 2018-09-10 MED ORDER — MEASLES, MUMPS & RUBELLA VAC IJ SOLR
0.5000 mL | Freq: Once | INTRAMUSCULAR | Status: DC
  Filled 2018-09-10: qty 0.5

## 2018-09-10 MED ORDER — PHENYLEPHRINE 40 MCG/ML (10ML) SYRINGE FOR IV PUSH (FOR BLOOD PRESSURE SUPPORT)
PREFILLED_SYRINGE | INTRAVENOUS | Status: AC
  Filled 2018-09-10: qty 10

## 2018-09-10 MED ORDER — FENTANYL CITRATE (PF) 100 MCG/2ML IJ SOLN
INTRAMUSCULAR | Status: AC
  Administered 2018-09-10: 100 ug via INTRAVENOUS
  Filled 2018-09-10: qty 2

## 2018-09-10 MED ORDER — SENNOSIDES-DOCUSATE SODIUM 8.6-50 MG PO TABS
2.0000 | ORAL_TABLET | ORAL | Status: DC
  Administered 2018-09-10 – 2018-09-11 (×2): 2 via ORAL
  Filled 2018-09-10 (×2): qty 2

## 2018-09-10 MED ORDER — OXYTOCIN 40 UNITS IN LACTATED RINGERS INFUSION - SIMPLE MED
2.5000 [IU]/h | INTRAVENOUS | Status: DC
  Filled 2018-09-10: qty 1000

## 2018-09-10 MED ORDER — DIBUCAINE 1 % RE OINT: 1.0000 "application " | TOPICAL_OINTMENT | RECTAL | Status: DC | PRN

## 2018-09-10 MED ORDER — LACTATED RINGERS IV SOLN
500.0000 mL | Freq: Once | INTRAVENOUS | Status: AC
  Administered 2018-09-10: 500 mL via INTRAVENOUS

## 2018-09-10 MED ORDER — IBUPROFEN 600 MG PO TABS
600.0000 mg | ORAL_TABLET | Freq: Four times a day (QID) | ORAL | Status: DC
  Administered 2018-09-10 – 2018-09-12 (×9): 600 mg via ORAL
  Filled 2018-09-10 (×9): qty 1

## 2018-09-10 MED ORDER — LIDOCAINE HCL (PF) 1 % IJ SOLN
30.0000 mL | INTRAMUSCULAR | Status: DC | PRN
  Filled 2018-09-10: qty 30

## 2018-09-10 MED ORDER — FENTANYL 2.5 MCG/ML BUPIVACAINE 1/10 % EPIDURAL INFUSION (WH - ANES)
14.0000 mL/h | INTRAMUSCULAR | Status: DC | PRN
  Administered 2018-09-10: 14 mL/h via EPIDURAL

## 2018-09-10 MED ORDER — ONDANSETRON HCL 4 MG/2ML IJ SOLN: 4.0000 mg | INTRAMUSCULAR | Status: DC | PRN

## 2018-09-10 MED ORDER — COCONUT OIL OIL: 1.0000 "application " | TOPICAL_OIL | Status: DC | PRN

## 2018-09-10 MED ORDER — PRENATAL MULTIVITAMIN CH
1.0000 | ORAL_TABLET | Freq: Every day | ORAL | Status: DC
  Administered 2018-09-11 – 2018-09-12 (×2): 1 via ORAL
  Filled 2018-09-10 (×3): qty 1

## 2018-09-10 NOTE — Progress Notes (Deleted)
Patient: Sheri Nunez MRN: [id number]  GBS status: Negative, IAP given: None   Patient is a 17 y.o. now G1P1 s/p NSVD at [redacted]w[redacted]d, who was admitted for SOL. AROM 0h 23m prior to delivery with light meconium stained fluid.    Delivery Note At 5:46 AM a viable female was delivered via Vaginal, Spontaneous (Presentation: OA).  APGAR: 8, 9; weight 3036g.   Placenta status: spontaneous, intact.  Cord:  3 vessel with the following complications: none.   Anesthesia:  Epidural  Episiotomy: None Lacerations: Vaginal Suture Repair: 3.0 monocryl Est. Blood Loss (mL):  112  Head delivered OA. No nuchal cord present. Shoulder and body delivered in usual fashion. Infant with spontaneous cry, placed on mother's abdomen, dried and bulb suctioned. Cord clamped x 2 after 1-minute delay, and cut by family member. Cord blood drawn. Placenta delivered spontaneously with gentle cord traction. Fundus firm with massage and Pitocin. Vagina inspected and found to have 1st degree  laceration, which was repaired with 3.0 monocryl with good hemostasis achieved.   Mom to postpartum.  Baby to Couplet care / Skin to Skin.  Tyliah Schlereth L Kenna Seward 09/10/2018, 6:13 AM 

## 2018-09-10 NOTE — MAU Note (Signed)
Pt states she started having painful contractions around midnight.

## 2018-09-10 NOTE — Anesthesia Procedure Notes (Signed)
Epidural Patient location during procedure: OB  Staffing Anesthesiologist: Shariece Viveiros, MD Performed: anesthesiologist   Preanesthetic Checklist Completed: patient identified, site marked, surgical consent, pre-op evaluation, timeout performed, IV checked, risks and benefits discussed and monitors and equipment checked  Epidural Patient position: sitting Prep: DuraPrep Patient monitoring: heart rate, continuous pulse ox and blood pressure Approach: right paramedian Location: L4-L5 Injection technique: LOR saline  Needle:  Needle type: Tuohy  Needle gauge: 17 G Needle length: 9 cm and 9 Needle insertion depth: 6 cm Catheter type: closed end flexible Catheter size: 20 Guage Catheter at skin depth: 10 cm Test dose: negative  Assessment Events: blood not aspirated, injection not painful, no injection resistance, negative IV test and no paresthesia  Additional Notes Patient identified. Risks/Benefits/Options discussed with patient including but not limited to bleeding, infection, nerve damage, paralysis, failed block, incomplete pain control, headache, blood pressure changes, nausea, vomiting, reactions to medication both or allergic, itching and postpartum back pain. Confirmed with bedside nurse the patient's most recent platelet count. Confirmed with patient that they are not currently taking any anticoagulation, have any bleeding history or any family history of bleeding disorders. Patient expressed understanding and wished to proceed. All questions were answered. Sterile technique was used throughout the entire procedure. Please see nursing notes for vital signs. Test dose was given through epidural needle and negative prior to continuing to dose epidural or start infusion. Warning signs of high block given to the patient including shortness of breath, tingling/numbness in hands, complete motor block, or any concerning symptoms with instructions to call for help. Patient was given  instructions on fall risk and not to get out of bed. All questions and concerns addressed with instructions to call with any issues.     

## 2018-09-10 NOTE — Lactation Note (Signed)
This note was copied from a baby's chart. Lactation Consultation Note  Patient Name: [name] Today's Date: 09/10/2018    [facility]  Attempted to visit with mother, however, she had a sign on her door requesting no visitors until after 1700.  Will return later today.                  Gerhardt Gleed R Teofila Bowery 09/10/2018, 3:27 PM

## 2018-09-10 NOTE — Lactation Note (Signed)
This note was copied from a baby's chart. Lactation Consultation Note  Patient Name: Sheri Nunez Today's Date: 09/10/2018 Reason for consult: Initial assessment;1st time breastfeeding;Primapara;Early term 37-38.6wks  P1 mother whose infant is now 3913 hours old.  This is an ETI at 38+0 weeks.  Baby has not fed since 1300 and is not showing feeding cues at this time.  I offered to awaken baby and assist with latching and mother accepted.   Suggested mother feed STS, remove all blankets and swaddles and change baby's diaper prior to latching.  Showed her how to awaken a sleepy baby.  Taught mother hand expression and drops of colostrum flowed easily from left breast.  I demonstrated finger feeding and encouraged mother to do this prior to latching to help baby awaken.  Assisted baby to latch in the football hold on the left breast and she began rhythmic sucking.  Lips were flanged, jaw movements strong and mother denied pain.  Demonstrated breast compressions and hand positioning for mother.  She has very long fingernails and will need to consider clipping her nails.  Encouraged to feed 8-12 times/24 hours of sooner if baby shows feeding cues.  Colostrum container provided for any EBM she may obtain with hand expression.  Milk storage times reviewed.  Mother will call for latch assistance as needed.  Mom made aware of O/P services, breastfeeding support groups, community resources, and our phone # for post-discharge questions. 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  She was already asking about pumping only and bottle feeding.  We discussed that August is many months from now so I suggested she continue to breast feed.  Explained that she can begin post pumping with the DEBP after breast feeding is well established.  Her mother was in the room and emphasized this to her daughter.  Many other visitors are present.     Maternal Data Formula Feeding for Exclusion:  No Has patient been taught Hand Expression?: Yes Does the patient have breastfeeding experience prior to this delivery?: No  Feeding Feeding Type: Breast Fed  LATCH Score Latch: Grasps breast easily, tongue down, lips flanged, rhythmical sucking.  Audible Swallowing: A few with stimulation  Type of Nipple: Everted at rest and after stimulation  Comfort (Breast/Nipple): Soft / non-tender  Hold (Positioning): Assistance needed to correctly position infant at breast and maintain latch.  LATCH Score: 8  Interventions Interventions: Breast feeding basics reviewed;Assisted with latch;Skin to skin;Breast massage;Hand express;Breast compression;Position options;Support pillows;Adjust position  Lactation Tools Discussed/Used     Consult Status Consult Status: Follow-up Date: 09/11/18 Follow-up type: In-patient    [name] 09/10/2018, 7:19 PM

## 2018-09-10 NOTE — Discharge Summary (Signed)
Postpartum Discharge Summary     Patient Name: Sheri Nunez DOB: 11/07/2000 MRN: [id number]  Date of admission: 09/10/2018 Delivering Provider: Arvilla MarketWALLACE, CATHERINE LAUREN   Date of discharge: 09/12/2018  Admitting diagnosis: 38 wks labor Intrauterine pregnancy: 2556w0d     Secondary diagnosis:  Active Problems:   Supervision of normal first pregnancy   Indication for care in labor or delivery  Additional problems: none     Discharge diagnosis: Term Pregnancy Delivered                                                                                                Post partum procedures:None  Augmentation: AROM  Complications: None  Hospital course:  Onset of Labor With Vaginal Delivery     17 y.o. yo G1P1001 at 8356w0d was admitted in Active Labor on 09/10/2018. Patient had an uncomplicated labor course as follows:  Membrane Rupture Time/Date: 5:23 AM ,09/10/2018   Intrapartum Procedures: Episiotomy: None [1]                                         Lacerations:  Vaginal [6]  Patient had a delivery of a Viable infant. 09/10/2018  Information for the patient's newborn:  Kizzie Fantasiaeoples, Girl Nygeria [[id number]][[id number]]  Delivery Method: Vag-Spont    Pateint had an uncomplicated postpartum course.  She is ambulating, tolerating a regular diet, passing flatus, and urinating well. Patient is discharged home in stable condition on 09/12/18.   Magnesium Sulfate recieved: No BMZ received: No  Physical exam  Vitals:   09/11/18 0516 09/11/18 1408 09/11/18 2315 09/12/18 0620  BP: 113/64 117/73 99/71 109/61  Pulse: 87 66 92 87  Resp: 18 17 18 18   Temp: 97.7 F (36.5 C) 98.3 F (36.8 C) 98.1 F (36.7 C)   TempSrc: Oral Oral Oral   SpO2: 99% 99% 98% 100%  Weight:      Height:       General: alert, cooperative and no distress Lochia: appropriate Uterine Fundus: firm Incision: N/A DVT Evaluation: No evidence of DVT seen on physical exam. Labs: Lab Results  Component Value Date   WBC  10.4 09/10/2018   HGB 10.9 (L) 09/10/2018   HCT 33.1 (L) 09/10/2018   MCV 93.0 09/10/2018   PLT 368 09/10/2018   No flowsheet data found.  Discharge instruction: per After Visit Summary and "Baby and Me Booklet".  After visit meds:  Allergies as of 09/12/2018   No Known Allergies     Medication List    TAKE these medications   acetaminophen 325 MG tablet Commonly known as:  TYLENOL Take 2 tablets (650 mg total) by mouth every 6 (six) hours as needed for moderate pain.   ferrous sulfate 325 (65 FE) MG tablet Take 1 tablet (325 mg total) by mouth daily with breakfast.   ibuprofen 600 MG tablet Commonly known as:  ADVIL,MOTRIN Take 1 tablet (600 mg total) by mouth every 6 (six) hours.   multivitamin-prenatal 27-0.8 MG Tabs tablet Take 1 tablet  by mouth daily at 12 noon.       Diet: routine diet  Activity: Advance as tolerated. Pelvic rest for 6 weeks.   Outpatient follow up:4 weeks Follow up Appt: Future Appointments  Date Time Provider Department Center  09/12/2018 10:50 AM Westley ChandlerBrown, Carina M, MD [facility]   Follow up Visit:   Please schedule this patient for Postpartum visit in: 4 weeks with the following provider: Any provider For C/S patients schedule nurse incision check in weeks 2 weeks: no Low risk pregnancy complicated by: None Delivery mode:  SVD Anticipated Birth Control:  IUD PP Procedures needed: None  Schedule Integrated BH visit: no      Newborn Data: Live born female  Birth Weight: 6 lb 11.1 oz (3036 g) APGAR: 8, 9  Newborn Delivery   Birth date/time:  09/10/2018 05:46:00 Delivery type:  Vaginal, Spontaneous     Baby Feeding: Breast Disposition:home with mother   09/12/2018 Gwenevere AbbotNimeka Ruby Logiudice, MD

## 2018-09-10 NOTE — H&P (Signed)
**Note Sheri-Identified via Obfuscation** LABOR AND DELIVERY ADMISSION HISTORY AND PHYSICAL NOTE  Sheri Nunez is a 17 y.o. female G1P0 with IUP at 451w0d by LMP and first trimester ultrasound presenting for SOL.  She reports positive fetal movement. She denies leakage of fluid or vaginal bleeding.  [facility] History/Complications[facility]   Pregnancy complications:  - Anemia, on Fe supplements   Past Medical History: Past Medical History:  Diagnosis Date  . Infection    UTI  . Medical history non-contributory     Past Surgical History: Past Surgical History:  Procedure Laterality Date  . NO PAST SURGERIES      Obstetrical History: OB History    Gravida  1   Para      Term      Preterm      AB      Living        SAB      TAB      Ectopic      Multiple      Live Births              Social History: Social History   Socioeconomic History  . Marital status: Single    Spouse name: Not on file  . Number of children: Not on file  . Years of education: Not on file  . Highest education level: Not on file  Occupational History  . Not on file  Social Needs  . Financial resource strain: Not on file  . Food insecurity:    Worry: Not on file    Inability: Not on file  . Transportation needs:    Medical: Not on file    Non-medical: Not on file  Tobacco Use  . Smoking status: Never Smoker  . Smokeless tobacco: Never Used  Substance and Sexual Activity  . Alcohol use: No  . Drug use: No  . Sexual activity: Not Currently    Birth control/protection: None  Lifestyle  . Physical activity:    Days per week: Not on file    Minutes per session: Not on file  . Stress: Not on file  Relationships  . Social connections:    Talks on phone: Not on file    Gets together: Not on file    Attends religious service: Not on file    Active member of club or organization: Not on file    Attends meetings of clubs or organizations: Not on file    Relationship status: Not on  file  Other Topics Concern  . Not on file  Social History Narrative  . Not on file    Family History: Family History  Problem Relation Age of Onset  . Healthy Mother   . Healthy Father     Allergies: No Known Allergies  Medications Prior to Admission  Medication Sig Dispense Refill Last Dose  . ferrous sulfate 325 (65 FE) MG tablet Take 1 tablet (325 mg total) by mouth daily with breakfast. 60 tablet 3 09/09/2018 at Unknown time  . Prenatal Vit-Fe Fumarate-FA (MULTIVITAMIN-PRENATAL) 27-0.8 MG TABS tablet Take 1 tablet by mouth daily at 12 noon. 180 each 0 09/09/2018 at Unknown time  . acetaminophen (TYLENOL) 325 MG tablet Take 2 tablets (650 mg total) by mouth every 6 (six) hours as needed for moderate pain. 100 tablet 2 Unknown at Unknown time     Review of Systems  All systems reviewed and negative except as stated in HPI  Physical Exam Blood pressure 136/80,  pulse 96, temperature 98.6 F (37 C), temperature source Oral, resp. rate 20, height 5\' 8"  (1.727 m), weight 79.6 kg, last menstrual period 12/18/2017, SpO2 100 %. General appearance: alert, oriented, NAD Lungs: normal respiratory effort Heart: regular rate Abdomen: soft, non-tender; gravid, FH appropriate for GA Extremities: No calf swelling or tenderness Presentation: cephalic Fetal monitoring: 140 bpm, moderate variability, +acels, no decels  Uterine activity: q2-4 min  Dilation: 5 Effacement (%): 80 Station: -2 Exam by:: Chukwuemeka, S. RNC  Prenatal labs: ABO, Rh: O/Positive/-- (06/25 1103) Antibody: Negative (06/25 1103) Rubella: 8.40 (06/25 1103) RPR: Non Reactive (11/04 1510)  HBsAg: Negative (06/25 1103)  HIV: Non Reactive (11/04 1510)  GC/Chlamydia: Negative  GBS:   Negative  GTT: Normal  Genetic screening:  Normal Quad  Anatomy US: Normal   Prenatal Transfer Tool  Maternal Diabetes: No Genetic Screening: Normal Maternal Ultrasounds/Referrals: Normal Fetal Ultrasounds or other Referrals:   None Maternal Substance Abuse:  No Significant Maternal Medications:  None Significant Maternal Lab Results: Lab values include: Group B Strep negative  No results found for this or any previous visit (from the past 24 hour(s)).  Patient Active Problem List   Diagnosis Date Noted  . Supervision of normal first pregnancy 01/28/2018  . 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 G1P0 at 6865w0d here for SOL.   #Labor: Expectant management.  #Pain: Epidural  #FWB: Cat I  #ID:  GBS neg  #MOF: Breast  #MOC: PP IUD  #Circ:  N/A   Sheri Nunez 09/10/2018, 3:11 AM

## 2018-09-10 NOTE — Anesthesia Preprocedure Evaluation (Signed)

## 2018-09-10 NOTE — Anesthesia Postprocedure Evaluation (Signed)
Anesthesia Post Note  Patient: Sheri Nunez  Procedure(s) Performed: AN AD HOC LABOR EPIDURAL     Patient location during evaluation: Mother Baby Anesthesia Type: Epidural Level of consciousness: awake and alert Pain management: pain level controlled Vital Signs Assessment: post-procedure vital signs reviewed and stable Respiratory status: spontaneous breathing, nonlabored ventilation and respiratory function stable Cardiovascular status: stable Postop Assessment: no headache, no backache and epidural receding Anesthetic complications: no    Last Vitals:  Vitals:   09/10/18 0843 09/10/18 1209  BP: (!) 106/54 128/78  Pulse: 95 75  Resp: 18 18  Temp: 37.3 C 37.3 C  SpO2: 99% 99%    Last Pain:  Vitals:   09/10/18 1209  TempSrc: Oral  PainSc: 3    Pain Goal: Patients Stated Pain Goal: 3 (09/10/18 1209)               [name],[name]

## 2018-09-11 ENCOUNTER — Encounter (HOSPITAL_COMMUNITY): Payer: Self-pay | Admitting: *Deleted

## 2018-09-11 LAB — RPR: RPR Ser Ql: NONREACTIVE

## 2018-09-11 NOTE — Progress Notes (Signed)
POSTPARTUM PROGRESS NOTE  Subjective:  Sheri Nunez is a 17 y.o. G1P1001 6153w0d s/p SVD.  No acute events overnight.  Pt denies problems with ambulating, voiding or po intake.  She denies nausea or vomiting.  Pain is moderately controlled.  She has had flatus. She has not had bowel movement.  Lochia Small.   Objective: Blood pressure 113/64, pulse 87, temperature 97.7 F (36.5 C), temperature source Oral, resp. rate 18, height 5\' 8"  (1.727 m), weight 79.6 kg, last menstrual period 12/18/2017, SpO2 99 %, unknown if currently breastfeeding.  Physical Exam:  General: alert, cooperative and no distress Lochia:normal flow Chest: no respiratory distress Heart:regular rate, distal pulses intact Incision: NA Abdomen: soft, nontender,  Uterine Fundus: firm, appropriately tender DVT Evaluation: No calf swelling or tenderness Extremities: no pedal edema  Recent Labs    09/10/18 0332  HGB 10.9*  HCT 33.1*    Assessment/Plan:  ASSESSMENT: Sheri Nunez is a 17 y.o. G1P1001 4153w0d s/p SVD. -doing well, no acute concerns -working on breastfeeding, feels this is going well -would like IUD for postpartum contraception  Plan for discharge tomorrow   LOS: 1 day   Sheri Nunez 09/11/2018, 7:35 AM

## 2018-09-12 ENCOUNTER — Encounter: Payer: Medicaid Other | Admitting: Family Medicine

## 2018-09-12 MED ORDER — IBUPROFEN 600 MG PO TABS: 600.0000 mg | ORAL_TABLET | Freq: Four times a day (QID) | ORAL | 0 refills | Status: DC

## 2018-09-12 NOTE — Lactation Note (Signed)
This note was copied from a baby's chart. Lactation Consultation Note  Patient Name: Sheri Nunez Today's Date: 09/12/2018   Mom's milk is coming to volume. Her anatomy suggests that she will have an abundant supply. Mom has chosen to pump & BO for the time being. She has not pumped since 0400. I recommended that she pump q3hrs (or more often, if needed).   Mom feels comfortable with the hand pump she was provided, but says that [facility] a DEBP for her. Mom knows about washing pump parts after every use & I informed her about sanitizing pump parts & bottle once/day.   Mom is currently using a Dr. Theora GianottiBrown's bottle with a Level 1 nipple. Mom says that infant is doing well with flow rate of the bottle.   Mom's R nipple is intact. Mom showed me that the underside of her L nipple had a small crack at the base. Mom reports that there had also been redness there, but the erythema has since resolved. Mom has coconut oil & I told her she could use that on the base of her nipple.   Mom's questions answered to her satisfaction.   Lurline HareRichey, Amandine Covino [facility] 09/12/2018, 9:51 AM

## 2018-09-15 ENCOUNTER — Other Ambulatory Visit: Payer: Self-pay | Admitting: Family Medicine

## 2018-09-15 DIAGNOSIS — Z789 Other specified health status: Secondary | ICD-10-CM

## 2018-09-15 MED ORDER — ERGOCALCIFEROL 50 MCG (2000 UT) PO TABS: 150.0000 ug | ORAL_TABLET | Freq: Every day | ORAL | 1 refills | Status: AC

## 2018-09-15 NOTE — Progress Notes (Signed)
Ordering Vit D supplementation for breastfeeding mom  -Dr. Parke Simmers

## 2019-01-30 ENCOUNTER — Other Ambulatory Visit: Payer: Self-pay

## 2019-01-30 ENCOUNTER — Ambulatory Visit (INDEPENDENT_AMBULATORY_CARE_PROVIDER_SITE_OTHER): Payer: Medicaid Other | Admitting: Family Medicine

## 2019-01-30 ENCOUNTER — Encounter: Payer: Self-pay | Admitting: Family Medicine

## 2019-01-30 ENCOUNTER — Telehealth: Payer: Self-pay | Admitting: Family Medicine

## 2019-01-30 ENCOUNTER — Other Ambulatory Visit (HOSPITAL_COMMUNITY)
Admission: RE | Admit: 2019-01-30 | Discharge: 2019-01-30 | Disposition: A | Discharge status: Home / Self Care | Payer: Medicaid Other | Source: Ambulatory Visit | Attending: Family Medicine | Admitting: Family Medicine

## 2019-01-30 VITALS — BP 112/68 | HR 85

## 2019-01-30 DIAGNOSIS — N898 Other specified noninflammatory disorders of vagina: Secondary | ICD-10-CM | POA: Insufficient documentation

## 2019-01-30 LAB — POCT WET PREP (WET MOUNT)
Clue Cells Wet Prep Whiff POC: NEGATIVE
Trichomonas Wet Prep HPF POC: ABSENT
WBC, Wet Prep HPF POC: >20

## 2019-01-30 MED ORDER — FLUCONAZOLE 150 MG PO TABS: 150.0000 mg | ORAL_TABLET | Freq: Once | ORAL | 0 refills | Status: AC

## 2019-01-30 NOTE — Patient Instructions (Signed)
It was great to meet you today! Thank you for letting me participate in your care!  Today, we discussed your recent symptoms which may be due to a yeast infection. I will call you directly with your test results.  Be well, Sheri Schick, DO PGY-2, [facility]

## 2019-01-30 NOTE — Telephone Encounter (Signed)
Called patient to inform her that she should have a pregnancy test before taking Diflucan. She informed me via phone she just finished her period yesterday. I still recommended to be safe to take a pregnancy test before taking the medication. Patient understands and agrees.

## 2019-01-30 NOTE — Assessment & Plan Note (Signed)
Wet prep showed only mod. Bacteria with no clue cells, no yeast, and no trichomonas. Given her symptoms of white discharge with itching it is feasible to have a yeast infection but a negative wet prep. I suspect that is what is causing her symptoms. - Diflucan 150mg  x 1 - GC/CC pending

## 2019-01-30 NOTE — Progress Notes (Signed)
     Subjective: Chief Complaint  Patient presents with  . Vaginal Discharge     HPI: Sheri Nunez is a 18 y.o. presenting to clinic today to discuss the following:  Vaginal Discharge Patient states she has noticed discharge for the past 3 days that has been "off and on" with mild intermittent itching. She has the same sexual partner that she has had for the past 2 years. They do have unprotected sex. She denies any fever, chills, abdominal pain, nausea or vomiting. The discharge is described as "whitish". No dysuria.  ROS noted in HPI.   Past Medical, Surgical, Social, and Family History Reviewed & Updated per EMR.   Pertinent Historical Findings include:   Social History   Tobacco Use  Smoking Status Never Smoker  Smokeless Tobacco Never Used    Objective: BP 112/68   Pulse 85   LMP 01/23/2019   SpO2 99%  Vitals and nursing notes reviewed  Physical Exam Gen: Alert and Oriented x 3, NAD GU: vulva normal, no rashes, lesions, erythema, or swelling of the vulva. Vaginal mucosa showed no signs of abrasions or rash. Cervix non-erythematous, no bleeding, +/- discharge, normal uterus. Mild white-mucous colored discharge. Skin: warm, dry, intact, no rashes  A chaperone was present for the entirety of the physical exam.  Results for orders placed or performed in visit on 01/30/19 (from the past 72 hour(s))  POCT Wet Prep [facility] [place])     Status: Abnormal   Collection Time: 01/30/19  2:35 PM  Result Value Ref Range   Source Wet Prep POC VAG    WBC, Wet Prep HPF POC >20    Bacteria Wet Prep HPF POC Many (A) Few   Clue Cells Wet Prep HPF POC None None   Clue Cells Wet Prep Whiff POC Negative Whiff    Yeast Wet Prep HPF POC None None   Trichomonas Wet Prep HPF POC Absent Absent    Assessment/Plan:  Vaginal discharge Wet prep showed only mod. Bacteria with no clue cells, no yeast, and no trichomonas. Given her symptoms of white discharge with itching it is feasible to  have a yeast infection but a negative wet prep. I suspect that is what is causing her symptoms. - Diflucan 150mg  x 1 - GC/CC pending   PATIENT EDUCATION PROVIDED: See AVS    Diagnosis and plan along with any newly prescribed medication(s) were discussed in detail with this patient today. The patient verbalized understanding and agreed with the plan. Patient advised if symptoms worsen return to clinic or ER.    Orders Placed This Encounter  Procedures  . POCT Wet Prep [facility])    Meds ordered this encounter  Medications  . fluconazole (DIFLUCAN) 150 MG tablet    Sig: Take 1 tablet (150 mg total) by mouth once for 1 dose.    Dispense:  1 tablet    Refill:  0     Jules Schick, DO 01/30/2019, 2:34 PM PGY-2 [facility]

## 2019-01-31 LAB — CERVICOVAGINAL ANCILLARY ONLY
Chlamydia: NEGATIVE
Neisseria Gonorrhea: NEGATIVE

## 2019-02-21 ENCOUNTER — Ambulatory Visit (INDEPENDENT_AMBULATORY_CARE_PROVIDER_SITE_OTHER): Payer: Medicaid Other | Admitting: Family Medicine

## 2019-02-21 ENCOUNTER — Other Ambulatory Visit: Payer: Self-pay

## 2019-02-21 ENCOUNTER — Other Ambulatory Visit (HOSPITAL_COMMUNITY)
Admission: RE | Admit: 2019-02-21 | Discharge: 2019-02-21 | Disposition: A | Discharge status: Home / Self Care | Payer: Medicaid Other | Source: Ambulatory Visit | Attending: Family Medicine | Admitting: Family Medicine

## 2019-02-21 VITALS — BP 100/58 | HR 102 | Wt 156.1 lb

## 2019-02-21 DIAGNOSIS — N898 Other specified noninflammatory disorders of vagina: Secondary | ICD-10-CM | POA: Diagnosis not present

## 2019-02-21 DIAGNOSIS — Z3009 Encounter for other general counseling and advice on contraception: Secondary | ICD-10-CM

## 2019-02-21 LAB — POCT WET PREP (WET MOUNT)
Clue Cells Wet Prep Whiff POC: NEGATIVE
Trichomonas Wet Prep HPF POC: ABSENT
WBC, Wet Prep HPF POC: >20

## 2019-02-21 LAB — POCT URINE PREGNANCY: Preg Test, Ur: NEGATIVE

## 2019-02-21 MED ORDER — FLUCONAZOLE 150 MG PO TABS: 150.0000 mg | ORAL_TABLET | Freq: Once | ORAL | 0 refills | Status: AC

## 2019-02-21 MED ORDER — NORGESTIMATE-ETH ESTRADIOL 0.25-35 MG-MCG PO TABS: 1.0000 | ORAL_TABLET | Freq: Every day | ORAL | 11 refills | Status: DC

## 2019-02-21 NOTE — Patient Instructions (Signed)
Use a back up method for the first 7 days.  Ethinyl Estradiol; Norgestimate tablets What is this medicine? ETHINYL ESTRADIOL; NORGESTIMATE (ETH in il es tra DYE ole; nor JES ti mate) is an oral contraceptive. The products combine two types of female hormones, an estrogen and a progestin. They are used to prevent ovulation and pregnancy. Some products are also used to treat acne in females. This medicine may be used for other purposes; ask your health care provider or pharmacist if you have questions. COMMON BRAND NAME(S): Estarylla, Mili, MONO-LINYAH, MonoNessa, Norgestimate/Ethinyl Estradiol, Ortho Tri-Cyclen, Ortho Tri-Cyclen Lo, Ortho-Cyclen, Previfem, Sprintec, Tri-Estarylla, TRI-LINYAH, Tri-Lo-Estarylla, Tri-Lo-Marzia, Tri-Lo-Mili, Tri-Lo-Sprintec, Tri-Mili, Tri-Previfem, Tri-Sprintec, Tri-VyLibra, Trinessa, Trinessa Lo, VyLibra What should I tell my health care provider before I take this medicine? They need to know if you have or ever had any of these conditions: -abnormal vaginal bleeding -blood vessel disease or blood clots -breast, cervical, endometrial, ovarian, liver, or uterine cancer -diabetes -gallbladder disease -heart disease or recent heart attack -high blood pressure -high cholesterol -kidney disease -liver disease -migraine headaches -stroke -systemic lupus erythematosus (SLE) -tobacco smoker -an unusual or allergic reaction to estrogens, progestins, other medicines, foods, dyes, or preservatives -pregnant or trying to get pregnant -breast-feeding How should I use this medicine? Take this medicine by mouth. To reduce nausea, this medicine may be taken with food. Follow the directions on the prescription label. Take this medicine at the same time each day and in the order directed on the package. Do not take your medicine more often than directed. Contact your pediatrician regarding the use of this medicine in children. Special care may be needed. This medicine has  been used in female children who have started having menstrual periods. A patient package insert for the product will be given with each prescription and refill. Read this sheet carefully each time. The sheet may change frequently. Overdosage: If you think you have taken too much of this medicine contact a poison control center or emergency room at once. NOTE: This medicine is only for you. Do not share this medicine with others. What if I miss a dose? If you miss a dose, refer to the patient information sheet you received with your medicine for direction. If you miss more than one pill, this medicine may not be as effective and you may need to use another form of birth control. What may interact with this medicine? Do not take this medicine with the following medication: -dasabuvir; ombitasvir; paritaprevir; ritonavir -ombitasvir; paritaprevir; ritonavir This medicine may also interact with the following medications: -acetaminophen -antibiotics or medicines for infections, especially rifampin, rifabutin, rifapentine, and griseofulvin, and possibly penicillins or tetracyclines -aprepitant -ascorbic acid (vitamin C) -atorvastatin -barbiturate medicines, such as phenobarbital -bosentan -carbamazepine -caffeine -clofibrate -cyclosporine -dantrolene -doxercalciferol -felbamate -grapefruit juice -hydrocortisone -medicines for anxiety or sleeping problems, such as diazepam or temazepam -medicines for diabetes, including pioglitazone -mineral oil -modafinil -mycophenolate -nefazodone -oxcarbazepine -phenytoin -prednisolone -ritonavir or other medicines for HIV infection or AIDS -rosuvastatin -selegiline -soy isoflavones supplements -St. John's wort -tamoxifen or raloxifene -theophylline -thyroid hormones -topiramate -warfarin This list may not describe all possible interactions. Give your health care provider a list of all the medicines, herbs, non-prescription drugs, or  dietary supplements you use. Also tell them if you smoke, drink alcohol, or use illegal drugs. Some items may interact with your medicine. What should I watch for while using this medicine? Visit your doctor or health care professional for regular checks on your progress. You will need a regular  breast and pelvic exam and Pap smear while on this medicine. You should also discuss the need for regular mammograms with your health care professional, and follow his or her guidelines for these tests. This medicine can make your body retain fluid, making your fingers, hands, or ankles swell. Your blood pressure can go up. Contact your doctor or health care professional if you feel you are retaining fluid. Use an additional method of contraception during the first cycle that you take these tablets. If you have any reason to think you are pregnant, stop taking this medicine right away and contact your doctor or health care professional. If you are taking this medicine for hormone related problems, it may take several cycles of use to see improvement in your condition. Do not use this product if you smoke and are over 20 years of age. Smoking increases the risk of getting a blood clot or having a stroke while you are taking birth control pills, especially if you are more than 20 years old. If you are a smoker who is 11035 years of age or younger, you are strongly advised not to smoke while taking birth control pills. This medicine can make you more sensitive to the sun. Keep out of the sun. If you cannot avoid being in the sun, wear protective clothing and use sunscreen. Do not use sun lamps or tanning beds/booths. If you wear contact lenses and notice visual changes, or if the lenses begin to feel uncomfortable, consult your eye care specialist. In some women, tenderness, swelling, or minor bleeding of the gums may occur. Notify your dentist if this happens. Brushing and flossing your teeth regularly may help limit  this. See your dentist regularly and inform your dentist of the medicines you are taking. If you are going to have elective surgery, you may need to stop taking this medicine before the surgery. Consult your health care professional for advice. This medicine does not protect you against HIV infection (AIDS) or any other sexually transmitted diseases. What side effects may I notice from receiving this medicine? Side effects that you should report to your doctor or health care professional as soon as possible: -breast tissue changes or discharge -changes in vaginal bleeding during your period or between your periods -chest pain -coughing up blood -dizziness or fainting spells -headaches or migraines -leg, arm or groin pain -severe or sudden headaches -stomach pain (severe) -sudden shortness of breath -sudden loss of coordination, especially on one side of the body -speech problems -symptoms of vaginal infection like itching, irritation or unusual discharge -tenderness in the upper abdomen -vomiting -weakness or numbness in the arms or legs, especially on one side of the body -yellowing of the eyes or skin Side effects that usually do not require medical attention (report to your doctor or health care professional if they continue or are bothersome): -breakthrough bleeding and spotting that continues beyond the 3 initial cycles of pills -breast tenderness -mood changes, anxiety, depression, frustration, anger, or emotional outbursts -increased sensitivity to sun or ultraviolet light -nausea -skin rash, acne, or brown spots on the skin -weight gain (slight) This list may not describe all possible side effects. Call your doctor for medical advice about side effects. You may report side effects to FDA at 1-800-FDA-1088. Where should I keep my medicine? Keep out of the reach of children. Store at room temperature between 15 and 30 degrees C (59 and 86 degrees F). Throw away any unused  medicine after the expiration date. NOTE: This  sheet is a summary. It may not cover all possible information. If you have questions about this medicine, talk to your doctor, pharmacist, or health care provider.  2019 Elsevier/Gold Standard (2016-05-11 08:09:09)

## 2019-02-21 NOTE — Progress Notes (Signed)
Subjective:  Sheri Nunez is a 18 y.o. female who presents to the [facility] today with a chief complaint of vaginal discharge.   HPI:  Patient states that she has had vaginal discharge for the last 1 week.  It seems to be slightly uncomfortable though not overly itchy.  Not noticed a strong odor.  She is sexually active and feels safe in relationship.  She has had unprotected sex in the last 5 days. Her periods are regular and she has not missed any.  She is interested in birth control.  She thinks that she would be very reliable at taking a pill every day. No family history of blood clots.  No personal history of migraines.   ROS: Per HPI  Social Hx: She reports that she has never smoked. She has never used smokeless tobacco. She reports that she does not drink alcohol or use drugs.   Objective:  Physical Exam: BP (!) 100/58   Pulse (!) 102   Wt 156 lb 2 oz (70.8 kg)   LMP 01/25/2019   SpO2 99%   BMI 23.74 kg/m   Gen: NAD, resting comfortably Pelvic exam: normal external genitalia, vulva, vagina, uterus and adnexa. Cervix notable for ectropion, no lesions.  Skin: warm, dry Neuro: grossly normal, moves all extremities Psych: Normal affect and thought content  Results for orders placed or performed in visit on 02/21/19 (from the past 72 hour(s))  POCT Wet Prep [facility])     Status: Abnormal   Collection Time: 02/21/19  2:24 PM  Result Value Ref Range   Source Wet Prep POC vaginal    WBC, Wet Prep HPF POC >20    Bacteria Wet Prep HPF POC Moderate (A) Few   Clue Cells Wet Prep HPF POC None None   Clue Cells Wet Prep Whiff POC Negative Whiff    Yeast Wet Prep HPF POC Few (A) None   KOH Wet Prep POC None None   Trichomonas Wet Prep HPF POC Absent Absent  POCT urine pregnancy     Status: None   Collection Time: 02/21/19  2:24 PM  Result Value Ref Range   Preg Test, Ur Negative Negative     Assessment/Plan:  1. Vaginal discharge Prep consistent with yeast, tx with  diflucan.  STD testing as per below.  Patient gave a confidential cell phone number which is [phone] for results. - Cervicovaginal ancillary only - POCT Wet Prep ([place]) - HIV antibody (with reflex) - RPR - POCT urine pregnancy - fluconazole (DIFLUCAN) 150 MG tablet; Take 1 tablet (150 mg total) by mouth once for 1 dose.  Dispense: 1 tablet; Refill: 0  2. Birth control counseling Appropriate for quick start method.  Discussed that chance of a very early pregnancy given that she has been sexually active in the last 5 days.  Patient voiced good understanding.  Also informed regarding using backup method for the next 7 days. - norgestimate-ethinyl estradiol (ORTHO-CYCLEN) 0.25-35 MG-MCG tablet; Take 1 tablet by mouth daily.  Dispense: 1 Package; Refill: 11   Orders Placed This Encounter  Procedures  . HIV antibody (with reflex)  . RPR  . POCT Wet Prep [facility])    Meds ordered this encounter  Medications  . norgestimate-ethinyl estradiol (ORTHO-CYCLEN) 0.25-35 MG-MCG tablet    Sig: Take 1 tablet by mouth daily.    Dispense:  1 Package    Refill:  11  . fluconazole (DIFLUCAN) 150 MG tablet    Sig: Take  1 tablet (150 mg total) by mouth once for 1 dose.    Dispense:  1 tablet    Refill:  0   Leland HerElsia J Armani Gawlik, DO PGY-3, [facility] 02/21/2019 2:00 PM

## 2019-02-22 LAB — RPR: RPR Ser Ql: NONREACTIVE

## 2019-02-22 LAB — HIV ANTIBODY (ROUTINE TESTING W REFLEX): HIV Screen 4th Generation wRfx: NONREACTIVE

## 2019-02-23 LAB — CERVICOVAGINAL ANCILLARY ONLY
Chlamydia: NEGATIVE
Neisseria Gonorrhea: NEGATIVE

## 2019-04-14 ENCOUNTER — Telehealth: Payer: Self-pay

## 2019-04-14 ENCOUNTER — Ambulatory Visit (INDEPENDENT_AMBULATORY_CARE_PROVIDER_SITE_OTHER): Payer: Medicaid Other | Admitting: Family Medicine

## 2019-04-14 ENCOUNTER — Other Ambulatory Visit: Payer: Self-pay

## 2019-04-14 VITALS — BP 106/60 | HR 74

## 2019-04-14 DIAGNOSIS — N898 Other specified noninflammatory disorders of vagina: Secondary | ICD-10-CM

## 2019-04-14 LAB — POCT URINALYSIS DIP (MANUAL ENTRY)
Bilirubin, UA: NEGATIVE
Blood, UA: NEGATIVE
Glucose, UA: NEGATIVE mg/dL
Ketones, POC UA: NEGATIVE mg/dL
Leukocytes, UA: NEGATIVE
Nitrite, UA: NEGATIVE
Protein Ur, POC: NEGATIVE mg/dL
Spec Grav, UA: 1.025 (ref 1.010–1.025)
Urobilinogen, UA: 0.2 E.U./dL
pH, UA: 6.5 (ref 5.0–8.0)

## 2019-04-14 LAB — POCT WET PREP (WET MOUNT)
Clue Cells Wet Prep Whiff POC: NEGATIVE
Trichomonas Wet Prep HPF POC: ABSENT

## 2019-04-14 NOTE — Telephone Encounter (Signed)
Pt left with getting blood work. Stated she had to pick up her baby. Was going to come back at 4:30 to get it done. LVM on cell phone stating that there is no opening for 4:30 but instead for 4:15. Asked pt to call back to see if she would be able to make the the 4:15 time slot. Salvatore Marvel, CMA

## 2019-04-14 NOTE — Progress Notes (Signed)
   Subjective:    Patient ID: Sheri Nunez, female    DOB: November 13, 2000, 18 y.o.   MRN: [id number]   CC: Vaginal discharge  HPI: The patient presents today with 3 days of vaginal discharge.  She is a sexually active patient with the same female partner, with whom she also has a daughter.  She denies any dysuria, urinary frequency, urinary urgency, vaginal itching or burning, or change in odor.  She has not tried any new soaps or shower gels.  She would like an STD screen "just to be safe".  Otherwise patient denies fevers, headaches, nausea, vomiting, abdominal pain, constipation, and diarrhea.  Review of Systems: See HPI   Objective:  BP 106/60   Pulse 74   LMP 03/15/2019   SpO2 98%   Breastfeeding No  Vitals and nursing note reviewed  General: well nourished, in no acute distress, very pleasant patient Cardiac: RRR, clear S1 and S2, no murmurs appreciated Respiratory:CTA bilaterally, no increased work of breathing Abdomen: soft, nontender to palpation, nondistended, bowel sounds auscultated throughout GU: White discharge appreciated on vaginal exam, normal in appearance, no particular odor noticed, minimal cervical bleeding, no tenderness to palpation Extremities: no edema or cyanosis. Warm, well perfused. 2+ radial and PT pulses bilaterally   Assessment & Plan:   Vaginal discharge Patient presenting with 3 days of concerning vaginal discharge.  Physical exam is normal. -We will check for GC/chlamydia, RPR, HIV, and trichomonas. -Will contact patient with results and treat as necessary.  Return if symptoms worsen or fail to improve.   Dr. Milus Banister [facility], PGY-2

## 2019-04-14 NOTE — Assessment & Plan Note (Signed)
Patient presenting with 3 days of concerning vaginal discharge.  Physical exam is normal. -We will check for GC/chlamydia, RPR, HIV, and trichomonas. -Will contact patient with results and treat as necessary.

## 2019-04-17 ENCOUNTER — Other Ambulatory Visit (HOSPITAL_COMMUNITY)
Admission: RE | Admit: 2019-04-17 | Discharge: 2019-04-17 | Disposition: A | Discharge status: Home / Self Care | Payer: Medicaid Other | Source: Ambulatory Visit | Attending: Family Medicine | Admitting: Family Medicine

## 2019-04-17 DIAGNOSIS — N898 Other specified noninflammatory disorders of vagina: Secondary | ICD-10-CM | POA: Insufficient documentation

## 2019-04-17 NOTE — Addendum Note (Signed)
Addended by: Dorna Bloom on: 04/17/2019 09:37 AM   Modules accepted: Orders

## 2019-04-18 LAB — CERVICOVAGINAL ANCILLARY ONLY
Chlamydia: NEGATIVE
Neisseria Gonorrhea: NEGATIVE
Trichomonas: NEGATIVE

## 2019-04-20 ENCOUNTER — Encounter: Payer: Self-pay | Admitting: Family Medicine

## 2019-04-20 ENCOUNTER — Telehealth: Payer: Self-pay | Admitting: *Deleted

## 2019-04-20 NOTE — Telephone Encounter (Signed)
Thank you! I just tried to call her back at 11:45am but there was no answer. I put together a results letter for her with a reminder of the August 10th appt at 8:45am.  Milus Banister, [employer], PGY-2 04/20/2019 12:03 PM

## 2019-04-20 NOTE — Telephone Encounter (Signed)
Pt called for results of gonorrhea and chlamydia.   Advised that they were negative.  Appt made for Monday to have HIV and RPR drawn. Christen Bame, CMA

## 2019-04-24 ENCOUNTER — Other Ambulatory Visit: Payer: Medicaid Other

## 2019-04-26 IMAGING — CR DG CHEST 2V
2 series · 2 of 2 positions shown · non-contrast
Comparison: None.

CLINICAL DATA: Productive cough and shortness of breath.

EXAM:
CHEST  2 VIEW

[chest pa]
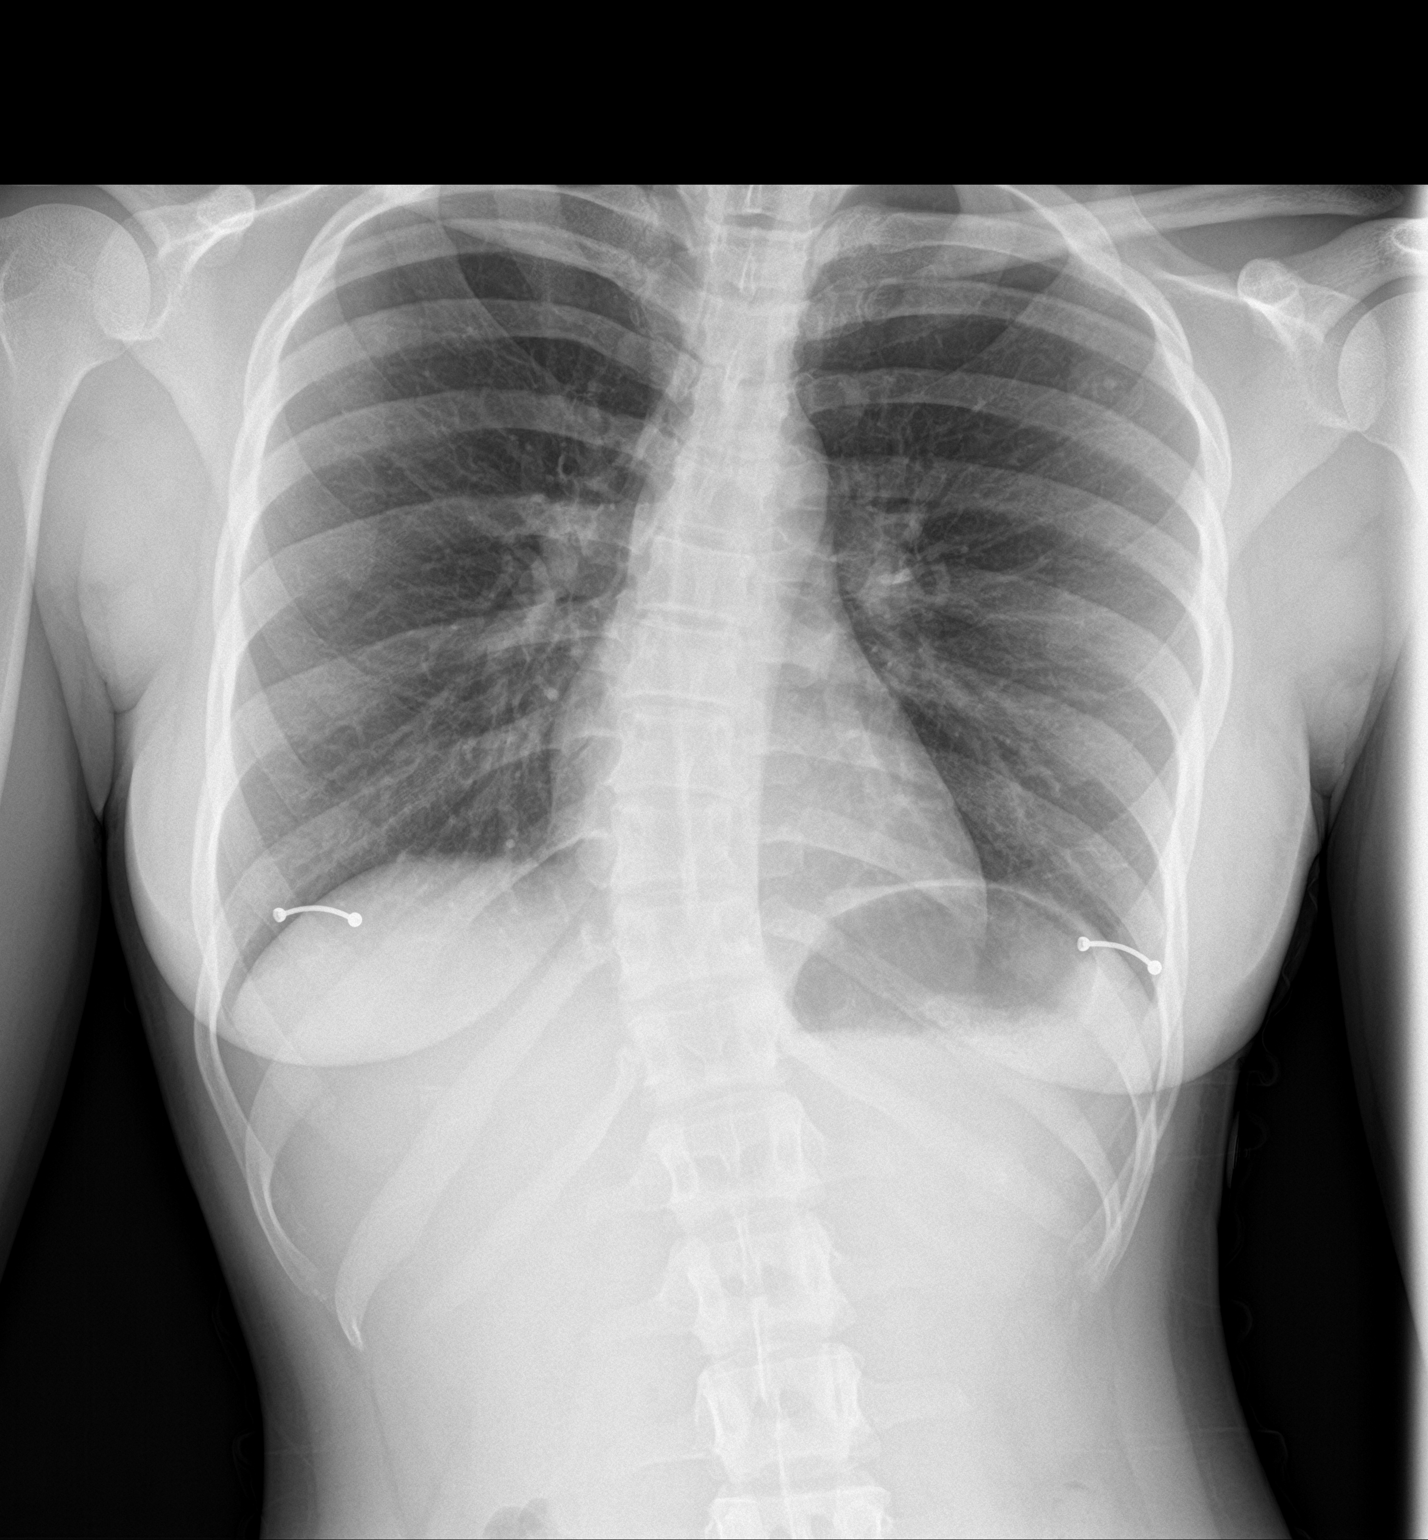

[chest lat]
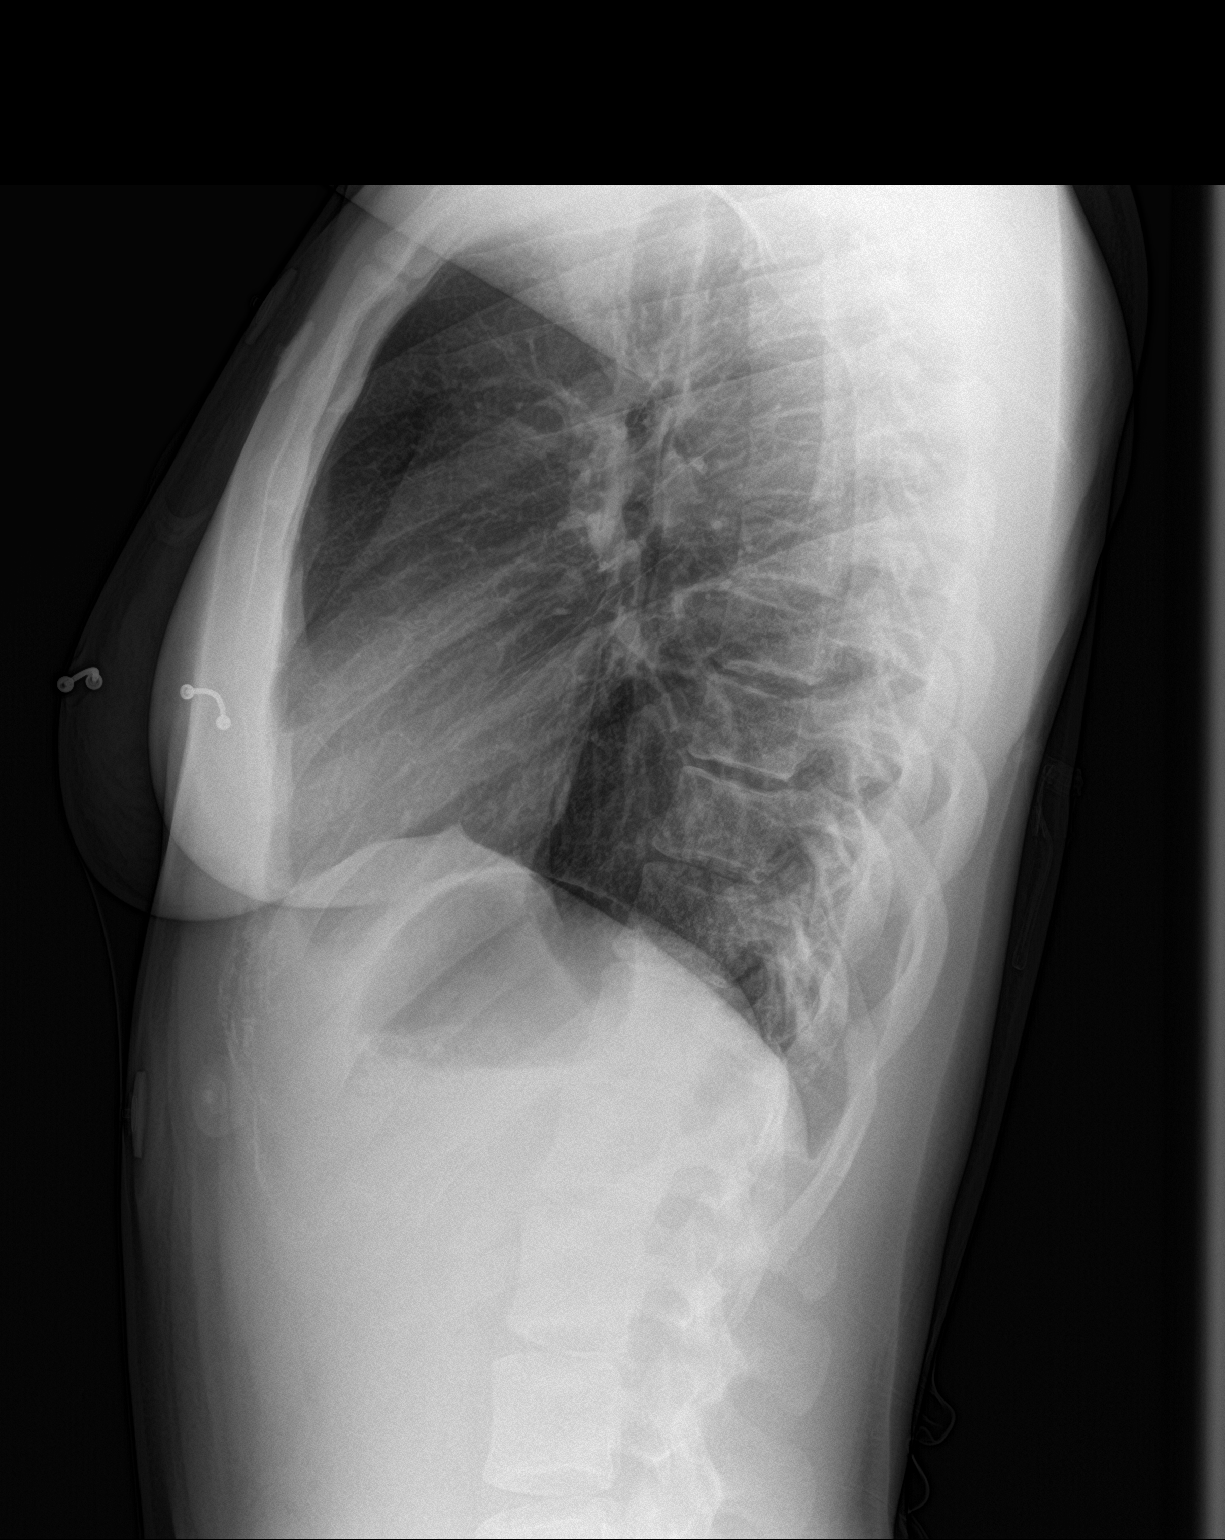

[2 of 2 positions shown; findings below may reference images not displayed]

FINDINGS: The heart size and mediastinal contours are within normal limits.
Both lungs are clear. The visualized skeletal structures are
unremarkable. Moderate thoracolumbar scoliosis
IMPRESSION: No acute cardiopulmonary findings.

## 2019-04-28 ENCOUNTER — Inpatient Hospital Stay (HOSPITAL_COMMUNITY)
Admission: AD | Admit: 2019-04-28 | Discharge: 2019-04-28 | Disposition: A | Discharge status: Home / Self Care | Payer: Medicaid Other | Attending: Obstetrics & Gynecology | Admitting: Obstetrics & Gynecology

## 2019-04-28 ENCOUNTER — Encounter (HOSPITAL_COMMUNITY): Payer: Self-pay | Admitting: *Deleted

## 2019-04-28 ENCOUNTER — Inpatient Hospital Stay (HOSPITAL_COMMUNITY): Payer: Medicaid Other

## 2019-04-28 ENCOUNTER — Other Ambulatory Visit: Payer: Self-pay

## 2019-04-28 DIAGNOSIS — O26899 Other specified pregnancy related conditions, unspecified trimester: Secondary | ICD-10-CM

## 2019-04-28 DIAGNOSIS — O26891 Other specified pregnancy related conditions, first trimester: Secondary | ICD-10-CM

## 2019-04-28 DIAGNOSIS — Z6790 Unspecified blood type, Rh positive: Secondary | ICD-10-CM

## 2019-04-28 DIAGNOSIS — Z3A08 8 weeks gestation of pregnancy: Secondary | ICD-10-CM | POA: Insufficient documentation

## 2019-04-28 DIAGNOSIS — O200 Threatened abortion: Secondary | ICD-10-CM

## 2019-04-28 DIAGNOSIS — Z3490 Encounter for supervision of normal pregnancy, unspecified, unspecified trimester: Secondary | ICD-10-CM

## 2019-04-28 DIAGNOSIS — R109 Unspecified abdominal pain: Secondary | ICD-10-CM | POA: Insufficient documentation

## 2019-04-28 DIAGNOSIS — O209 Hemorrhage in early pregnancy, unspecified: Secondary | ICD-10-CM | POA: Diagnosis not present

## 2019-04-28 DIAGNOSIS — Z3A01 Less than 8 weeks gestation of pregnancy: Secondary | ICD-10-CM | POA: Diagnosis not present

## 2019-04-28 LAB — CBC
HCT: 36.1 % (ref 36.0–46.0)
Hemoglobin: 12.2 g/dL (ref 12.0–15.0)
MCH: 30.2 pg (ref 26.0–34.0)
MCHC: 33.8 g/dL (ref 30.0–36.0)
MCV: 89.4 fL (ref 80.0–100.0)
Platelets: 368 10*3/uL (ref 150–400)
RBC: 4.04 MIL/uL (ref 3.87–5.11)
RDW: 12.3 % (ref 11.5–15.5)
WBC: 5.1 10*3/uL (ref 4.0–10.5)
nRBC: 0 % (ref 0.0–0.2)

## 2019-04-28 LAB — URINALYSIS, ROUTINE W REFLEX MICROSCOPIC
Bilirubin Urine: NEGATIVE
Glucose, UA: NEGATIVE mg/dL
Ketones, ur: NEGATIVE mg/dL
Nitrite: NEGATIVE
Protein, ur: NEGATIVE mg/dL
Specific Gravity, Urine: 1.026 (ref 1.005–1.030)
pH: 7 (ref 5.0–8.0)

## 2019-04-28 LAB — POCT PREGNANCY, URINE: Preg Test, Ur: POSITIVE — AB

## 2019-04-28 LAB — ABO/RH: ABO/RH(D): O POS

## 2019-04-28 LAB — HCG, QUANTITATIVE, PREGNANCY: hCG, Beta Chain, Quant, S: 16356 m[IU]/mL — ABNORMAL HIGH (ref <5)

## 2019-04-28 NOTE — MAU Note (Signed)
.   Sheri Nunez is a 18 y.o. at [redacted]w[redacted]d here in MAU reporting: lower abdominal cramping with vaginal bleeding that started 4 days ago.  LMP: 02/26/19 Onset of complaint: 4 days Pain score: 3 Vitals:   04/28/19 1719 04/28/19 1721  BP:  117/69  Pulse: 74   Resp: 16   Temp: 98 F (36.7 C)   SpO2: 100%      [facility] Lab orders placed from triage: UA/UPT

## 2019-04-28 NOTE — Discharge Instructions (Signed)
Vaginal Bleeding During Pregnancy, First Trimester ° °A small amount of bleeding (spotting) from the vagina is common during early pregnancy. Sometimes the bleeding is normal and does not cause problems. At other times, though, bleeding may be a sign of something serious. Tell your doctor about any bleeding from your vagina right away. °Follow these instructions at home: °Activity °· Follow your doctor's instructions about how active you can be. °· If needed, make plans for someone to help with your normal activities. °· Do not have sex or orgasms until your doctor says that this is safe. °General instructions °· Take over-the-counter and prescription medicines only as told by your doctor. °· Watch your condition for any changes. °· Write down: °? The number of pads you use each day. °? How often you change pads. °? How soaked (saturated) your pads are. °· Do not use tampons. °· Do not douche. °· If you pass any tissue from your vagina, save it to show to your doctor. °· Keep all follow-up visits as told by your doctor. This is important. °Contact a doctor if: °· You have vaginal bleeding at any time while you are pregnant. °· You have cramps. °· You have a fever. °Get help right away if: °· You have very bad cramps in your back or belly (abdomen). °· You pass large clots or a lot of tissue from your vagina. °· Your bleeding gets worse. °· You feel light-headed. °· You feel weak. °· You pass out (faint). °· You have chills. °· You are leaking fluid from your vagina. °· You have a gush of fluid from your vagina. °Summary °· Sometimes vaginal bleeding during pregnancy is normal and does not cause problems. At other times, bleeding may be a sign of something serious. °· Tell your doctor about any bleeding from your vagina right away. °· Follow your doctor's instructions about how active you can be. You may need someone to help you with your normal activities. °This information is not intended to replace advice given to  you by your health care provider. Make sure you discuss any questions you have with your health care provider. °Document Released: 01/15/2014 Document Revised: 12/20/2018 Document Reviewed: 12/02/2016 °Elsevier Patient Education © 2020 Elsevier Inc. ° °

## 2019-04-28 NOTE — MAU Provider Note (Signed)
History     CSN: [id number]  Arrival date and time: 04/28/19 1616   First Provider Initiated Contact with Patient 04/28/19 1730      Chief Complaint  Patient presents with  . Vaginal Bleeding  . Abdominal Pain   18 y.o. G2P1001 @[redacted]w[redacted]d  by unsure LMP presenting with VB and LAP. Sx started 4 days ago. Bleeding is similar to a menstrual cycle. She is passing small clots. Describes pain like menstrual cramps, intermittent. Has not tried anything for it. Rates 3/10. Pt states she is planning a termination.  OB History    Gravida  2   Para  1   Term  1   Preterm      AB      Living  1     SAB      TAB      Ectopic      Multiple  0   Live Births  1           Past Medical History:  Diagnosis Date  . Infection    UTI  . Medical history non-contributory     Past Surgical History:  Procedure Laterality Date  . NO PAST SURGERIES      Family History  Problem Relation Age of Onset  . Healthy Mother   . Healthy Father     Social History   Tobacco Use  . Smoking status: Never Smoker  . Smokeless tobacco: Never Used  Substance Use Topics  . Alcohol use: No  . Drug use: No    Allergies: No Known Allergies  Medications Prior to Admission  Medication Sig Dispense Refill Last Dose  . acetaminophen (TYLENOL) 325 MG tablet Take 2 tablets (650 mg total) by mouth every 6 (six) hours as needed for moderate pain. 100 tablet 2 Past Month at Unknown time  . Prenatal Vit-Fe Fumarate-FA (MULTIVITAMIN-PRENATAL) 27-0.8 MG TABS tablet Take 1 tablet by mouth daily at 12 noon. 180 each 0 04/28/2019 at Unknown time  . ferrous sulfate 325 (65 FE) MG tablet Take 1 tablet (325 mg total) by mouth daily with breakfast. 60 tablet 3   . ibuprofen (ADVIL,MOTRIN) 600 MG tablet Take 1 tablet (600 mg total) by mouth every 6 (six) hours. 30 tablet 0   . norgestimate-ethinyl estradiol (ORTHO-CYCLEN) 0.25-35 MG-MCG tablet Take 1 tablet by mouth daily. 1 Package 11     Review of Systems   Constitutional: Negative for fever.  Gastrointestinal: Positive for abdominal pain.  Genitourinary: Positive for vaginal bleeding. Negative for dysuria, frequency and urgency.   Physical Exam   Blood pressure 117/69, pulse 74, temperature 98 F (36.7 C), resp. rate 16, last menstrual period 02/26/2019, SpO2 100 %, not currently breastfeeding.  Physical Exam  Nursing note and vitals reviewed. Constitutional: She is oriented to person, place, and time. She appears well-developed and well-nourished. No distress.  HENT:  Head: Normocephalic and atraumatic.  Neck: Normal range of motion.  Cardiovascular: Normal rate.  Respiratory: Effort normal. No respiratory distress.  GI: Soft. She exhibits no distension and no mass. There is no abdominal tenderness. There is no rebound and no guarding.  Genitourinary:    Genitourinary Comments: External: nop lesions or erythema Vagina: rugated, pink, moist, small amt bloody discharge Uterus: non enlarged, anteverted, non tender, no CMT Adnexae: no masses, no tenderness left, no tenderness right Cervix closed    Musculoskeletal: Normal range of motion.  Neurological: She is alert and oriented to person, place, and time.  Skin: Skin is warm  and dry.  Psychiatric: She has a normal mood and affect.   Results for orders placed or performed during the hospital encounter of 04/28/19 (from the past 24 hour(s))  Pregnancy, urine POC     Status: Abnormal   Collection Time: 04/28/19  4:43 PM  Result Value Ref Range   Preg Test, Ur POSITIVE (A) NEGATIVE  Urinalysis, Routine w reflex microscopic     Status: Abnormal   Collection Time: 04/28/19  5:01 PM  Result Value Ref Range   Color, Urine YELLOW YELLOW   APPearance HAZY (A) CLEAR   Specific Gravity, Urine 1.026 1.005 - 1.030   pH 7.0 5.0 - 8.0   Glucose, UA NEGATIVE NEGATIVE mg/dL   Hgb urine dipstick MODERATE (A) NEGATIVE   Bilirubin Urine NEGATIVE NEGATIVE   Ketones, ur NEGATIVE NEGATIVE mg/dL    Protein, ur NEGATIVE NEGATIVE mg/dL   Nitrite NEGATIVE NEGATIVE   Leukocytes,Ua SMALL (A) NEGATIVE   RBC / HPF 0-5 0 - 5 RBC/hpf   WBC, UA 0-5 0 - 5 WBC/hpf   Bacteria, UA FEW (A) NONE SEEN   Squamous Epithelial / LPF 6-10 0 - 5   Mucus PRESENT   CBC     Status: None   Collection Time: 04/28/19  5:44 PM  Result Value Ref Range   WBC 5.1 4.0 - 10.5 K/uL   RBC 4.04 3.87 - 5.11 MIL/uL   Hemoglobin 12.2 12.0 - 15.0 g/dL   HCT 16.136.1 09.636.0 - 04.546.0 %   MCV 89.4 80.0 - 100.0 fL   MCH 30.2 26.0 - 34.0 pg   MCHC 33.8 30.0 - 36.0 g/dL   RDW 40.912.3 81.111.5 - 91.415.5 %   Platelets 368 150 - 400 K/uL   nRBC 0.0 0.0 - 0.2 %  hCG, quantitative, pregnancy     Status: Abnormal   Collection Time: 04/28/19  5:44 PM  Result Value Ref Range   hCG, Beta Chain, Quant, S 16,356 (H) <5 mIU/mL  ABO/Rh     Status: None   Collection Time: 04/28/19  5:45 PM  Result Value Ref Range   ABO/RH(D) O POS    No rh immune globuloin      NOT A RH IMMUNE GLOBULIN CANDIDATE, PT RH POSITIVE Performed at [facility], [address] [place], [place] [id number]    [place] Ob Less Than 14 Weeks With Ob Transvaginal  Result Date: 04/28/2019 CLINICAL DATA:  18 year old pregnant female with vaginal bleeding. LMP: 02/26/2019 corresponding to an estimated gestational age of [redacted] weeks, 5 days. EXAM: OBSTETRIC <14 WK US AND TRANSVAGINAL OB US TECHNIQUE: Both transabdominal and transvaginal ultrasound examinations were performed for complete evaluation of the gestation as well as the maternal uterus, adnexal regions, and pelvic cul-de-sac. Transvaginal technique was performed to assess early pregnancy. COMPARISON:  None. FINDINGS: Intrauterine gestational sac: Single intrauterine gestational sac. Yolk sac: A rounded structure measuring approximately 9 mm in diameter represents the amniotic sac and less likely an enlarged yolk sac. Embryo:  Present Cardiac Activity: Not detected CRL:  3 mm   5 w   6 d                  US EDC: 12/23/2019  Subchorionic hemorrhage:  None visualized. Maternal uterus/adnexae: The maternal ovaries are unremarkable. IMPRESSION: Single intrauterine pregnancy with an estimated gestational age of [redacted] weeks, 6 days. No cardiac activity identified at this time likely due to early gestation. Follow-up with ultrasound in 7-11 days, or earlier if clinically  indicated, recommended. Electronically Signed   By: Anner Crete M.D.   On: 04/28/2019 19:48   MAU Course  Procedures  MDM Labs and [place] ordered and reviewed. IUP identified, no FHR detected although pregnancy very early. Discussed findings with pt. Pt still planning termination. Stable for discharge home.   Assessment and Plan   1. Early stage of pregnancy   2. Abdominal pain in pregnancy   3. Blood type, Rh positive   4. Threatened abortion    Discharge home Follow up with OB provider of choice for further care SAB precautions  Allergies as of 04/28/2019   No Known Allergies     Medication List    STOP taking these medications   ferrous sulfate 325 (65 FE) MG tablet   ibuprofen 600 MG tablet Commonly known as: ADVIL   norgestimate-ethinyl estradiol 0.25-35 MG-MCG tablet Commonly known as: ORTHO-CYCLEN     TAKE these medications   acetaminophen 325 MG tablet Commonly known as: Tylenol Take 2 tablets (650 mg total) by mouth every 6 (six) hours as needed for moderate pain.   multivitamin-prenatal 27-0.8 MG Tabs tablet Take 1 tablet by mouth daily at 12 noon.      Julianne Handler, CNM 04/28/2019, 8:20 PM

## 2019-05-02 ENCOUNTER — Encounter (HOSPITAL_COMMUNITY): Payer: Self-pay | Admitting: *Deleted

## 2019-05-02 ENCOUNTER — Inpatient Hospital Stay (HOSPITAL_COMMUNITY): Payer: Medicaid Other

## 2019-05-02 ENCOUNTER — Other Ambulatory Visit: Payer: Self-pay

## 2019-05-02 ENCOUNTER — Inpatient Hospital Stay (HOSPITAL_COMMUNITY)
Admission: AD | Admit: 2019-05-02 | Discharge: 2019-05-02 | Disposition: A | Discharge status: Home / Self Care | Payer: Medicaid Other | Attending: Obstetrics & Gynecology | Admitting: Obstetrics & Gynecology

## 2019-05-02 DIAGNOSIS — Z6790 Unspecified blood type, Rh positive: Secondary | ICD-10-CM | POA: Diagnosis not present

## 2019-05-02 DIAGNOSIS — O039 Complete or unspecified spontaneous abortion without complication: Secondary | ICD-10-CM | POA: Insufficient documentation

## 2019-05-02 DIAGNOSIS — O209 Hemorrhage in early pregnancy, unspecified: Secondary | ICD-10-CM | POA: Diagnosis not present

## 2019-05-02 DIAGNOSIS — R1030 Lower abdominal pain, unspecified: Secondary | ICD-10-CM | POA: Diagnosis not present

## 2019-05-02 DIAGNOSIS — Z3A01 Less than 8 weeks gestation of pregnancy: Secondary | ICD-10-CM | POA: Diagnosis not present

## 2019-05-02 DIAGNOSIS — O26891 Other specified pregnancy related conditions, first trimester: Secondary | ICD-10-CM | POA: Insufficient documentation

## 2019-05-02 DIAGNOSIS — Z3A00 Weeks of gestation of pregnancy not specified: Secondary | ICD-10-CM | POA: Diagnosis not present

## 2019-05-02 LAB — CBC
HCT: 33.8 % — ABNORMAL LOW (ref 36.0–46.0)
Hemoglobin: 11.3 g/dL — ABNORMAL LOW (ref 12.0–15.0)
MCH: 29.7 pg (ref 26.0–34.0)
MCHC: 33.4 g/dL (ref 30.0–36.0)
MCV: 88.7 fL (ref 80.0–100.0)
Platelets: 360 10*3/uL (ref 150–400)
RBC: 3.81 MIL/uL — ABNORMAL LOW (ref 3.87–5.11)
RDW: 12.3 % (ref 11.5–15.5)
WBC: 5.5 10*3/uL (ref 4.0–10.5)
nRBC: 0 % (ref 0.0–0.2)

## 2019-05-02 LAB — HCG, QUANTITATIVE, PREGNANCY: hCG, Beta Chain, Quant, S: 9738 m[IU]/mL — ABNORMAL HIGH (ref <5)

## 2019-05-02 MED ORDER — IBUPROFEN 600 MG PO TABS: 600.0000 mg | ORAL_TABLET | Freq: Four times a day (QID) | ORAL | 0 refills | Status: DC | PRN

## 2019-05-02 NOTE — MAU Provider Note (Signed)
History     CSN: [id number]  Arrival date and time: 05/02/19 1842   First Provider Initiated Contact with Patient 05/02/19 1938      Chief Complaint  Patient presents with  . Vaginal Bleeding   18 y.o. G2P1001 @[redacted]w[redacted]d  presenting with VB. Pt was seen 4 days ago in MAU for bleeding but it became heavier today. She passed 3 baseball sized clots. Saturating pads over 1.5 hrs. Reports low abd cramping. Rates 5/10. Has not taken anything for it. Last US showed IUP with FP measuring 7414w6d and no cardiac activity.   OB History    Gravida  2   Para  1   Term  1   Preterm      AB      Living  1     SAB      TAB      Ectopic      Multiple  0   Live Births  1           Past Medical History:  Diagnosis Date  . Infection    UTI  . Medical history non-contributory     Past Surgical History:  Procedure Laterality Date  . NO PAST SURGERIES      Family History  Problem Relation Age of Onset  . Healthy Mother   . Healthy Father     Social History   Tobacco Use  . Smoking status: Never Smoker  . Smokeless tobacco: Never Used  Substance Use Topics  . Alcohol use: No  . Drug use: No    Allergies: No Known Allergies  Medications Prior to Admission  Medication Sig Dispense Refill Last Dose  . Prenatal Vit-Fe Fumarate-FA (MULTIVITAMIN-PRENATAL) 27-0.8 MG TABS tablet Take 1 tablet by mouth daily at 12 noon. 180 each 0 05/02/2019 at Unknown time  . acetaminophen (TYLENOL) 325 MG tablet Take 2 tablets (650 mg total) by mouth every 6 (six) hours as needed for moderate pain. 100 tablet 2 Unknown at Unknown time    Review of Systems  Gastrointestinal: Positive for abdominal pain.  Genitourinary: Positive for vaginal bleeding.   Physical Exam   Blood pressure 108/70, pulse 76, temperature 98.3 F (36.8 C), temperature source Oral, weight 69 kg, last menstrual period 02/26/2019, not currently breastfeeding.  Physical Exam  Nursing note and vitals  reviewed. Constitutional: She is oriented to person, place, and time. She appears well-developed and well-nourished. No distress.  HENT:  Head: Normocephalic and atraumatic.  Neck: Normal range of motion.  Cardiovascular: Normal rate.  Respiratory: Effort normal. No respiratory distress.  GI: Soft. She exhibits no distension and no mass. There is no abdominal tenderness. There is no rebound and no guarding.  Genitourinary:    Genitourinary Comments: External: no lesions or erythema Vagina: rugated, pink, moist, mod bloody discharge, cleared with 2 fox swabs, no active bleeding Cervix closed/long    Musculoskeletal: Normal range of motion.  Neurological: She is alert and oriented to person, place, and time.  Skin: Skin is warm and dry.  Psychiatric: She has a normal mood and affect.   Results for orders placed or performed during the hospital encounter of 05/02/19 (from the past 24 hour(s))  CBC     Status: Abnormal   Collection Time: 05/02/19  7:47 PM  Result Value Ref Range   WBC 5.5 4.0 - 10.5 K/uL   RBC 3.81 (L) 3.87 - 5.11 MIL/uL   Hemoglobin 11.3 (L) 12.0 - 15.0 g/dL   HCT 40.933.8 (L) 81.136.0 -  46.0 %   MCV 88.7 80.0 - 100.0 fL   MCH 29.7 26.0 - 34.0 pg   MCHC 33.4 30.0 - 36.0 g/dL   RDW 12.3 11.5 - 15.5 %   Platelets 360 150 - 400 K/uL   nRBC 0.0 0.0 - 0.2 %  hCG, quantitative, pregnancy     Status: Abnormal   Collection Time: 05/02/19  7:47 PM  Result Value Ref Range   hCG, Beta Chain, Quant, S 9,738 (H) <5 mIU/mL    US Ob Transvaginal  Result Date: 05/02/2019 CLINICAL DATA:  Vaginal bleeding EXAM: TRANSVAGINAL OB ULTRASOUND TECHNIQUE: Transvaginal ultrasound was performed for complete evaluation of the gestation as well as the maternal uterus, adnexal regions, and pelvic cul-de-sac. COMPARISON:  None. FINDINGS: Intrauterine gestational sac: None Yolk sac:  Not Visualized. Embryo:  Not Visualized. Cardiac Activity: Not Visualized. Maternal uterus/adnexae: There is no  significant maternal abnormality. The endometrium measures approximately 10 mm in width. IMPRESSION: No IUP identified. In the setting of positive pregnancy test and no definite intrauterine pregnancy, this reflects a pregnancy of unknown location. Differential considerations include early normal IUP, abnormal IUP, or nonvisualized ectopic pregnancy. Differentiation is achieved with serial beta HCG supplemented by repeat sonography as clinically warranted. Electronically Signed   By: Constance Holster M.D.   On: 05/02/2019 20:47   MAU Course  Procedures  MDM Labs and [place] ordered and reviewed. No IUP on [place] today and quant hcg significantly lower indicating SAB. Discussed with pt, support offered. Pt was originally considering termination. Stable for discharge home.  Assessment and Plan   1. SAB (spontaneous abortion)   2. Blood type, Rh positive    Discharge home Follow up at [facility] in 2 weeks Pelvic rest Bleeding/return precautions Rx Ibuprofen  Allergies as of 05/02/2019   No Known Allergies     Medication List    TAKE these medications   acetaminophen 325 MG tablet Commonly known as: Tylenol Take 2 tablets (650 mg total) by mouth every 6 (six) hours as needed for moderate pain.   ibuprofen 600 MG tablet Commonly known as: ADVIL Take 1 tablet (600 mg total) by mouth every 6 (six) hours as needed for cramping.   multivitamin-prenatal 27-0.8 MG Tabs tablet Take 1 tablet by mouth daily at 12 noon.      Julianne Handler, CNM 05/02/2019, 9:40 PM

## 2019-05-02 NOTE — Discharge Instructions (Signed)
Miscarriage °A miscarriage is the loss of an unborn baby (fetus) before the 20th week of pregnancy. °Follow these instructions at home: °Medicines ° °· Take over-the-counter and prescription medicines only as told by your doctor. °· If you were prescribed antibiotic medicine, take it as told by your doctor. Do not stop taking the antibiotic even if you start to feel better. °· Do not take NSAIDs unless your doctor says that this is safe for you. NSAIDs include aspirin and ibuprofen. These medicines can cause bleeding. °Activity °· Rest as directed. Ask your doctor what activities are safe for you. °· Have someone help you at home during this time. °General instructions °· Write down how many pads you use each day and how soaked they are. °· Watch the amount of tissue or clumps of blood (blood clots) that you pass from your vagina. Save any large amounts of tissue for your doctor. °· Do not use tampons, douche, or have sex until your doctor approves. °· To help you and your partner with the process of grieving, talk with your doctor or seek counseling. °· When you are ready, meet with your doctor to talk about steps you should take for your health. Also, talk with your doctor about steps to take to have a healthy pregnancy in the future. °· Keep all follow-up visits as told by your doctor. This is important. °Contact a doctor if: °· You have a fever or chills. °· You have vaginal discharge that smells bad. °· You have more bleeding. °Get help right away if: °· You have very bad cramps or pain in your back or belly. °· You pass clumps of blood that are walnut-sized or larger from your vagina. °· You pass tissue that is walnut-sized or larger from your vagina. °· You soak more than 1 regular pad in an hour. °· You get light-headed or weak. °· You faint (pass out). °· You have feelings of sadness that do not go away, or you have thoughts of hurting yourself. °Summary °· A miscarriage is the loss of an unborn baby before  the 20th week of pregnancy. °· Follow your doctor's instructions for home care. Keep all follow-up appointments. °· To help you and your partner with the process of grieving, talk with your doctor or seek counseling. °This information is not intended to replace advice given to you by your health care provider. Make sure you discuss any questions you have with your health care provider. °Document Released: 11/23/2011 Document Revised: 12/23/2018 Document Reviewed: 10/06/2016 °Elsevier Patient Education © 2020 Elsevier Inc. ° °

## 2019-05-02 NOTE — MAU Note (Signed)
Pt reports to MAU c/o vaginal bleeding that has been ongoing x1 week then today pt reports she passed 3 large clots that were bigger than a golf ball pt reports they were dark red. Pt reports she has a child at home and she is still BF that baby. Pt reports some abdominal cramping that is a 8/10. Pt reports she is unable to decide if she is wanting this pregnancy or not due to her being in school and having a baby at home.

## 2019-05-26 ENCOUNTER — Other Ambulatory Visit (HOSPITAL_COMMUNITY)
Admission: RE | Admit: 2019-05-26 | Discharge: 2019-05-26 | Disposition: A | Discharge status: Home / Self Care | Payer: Medicaid Other | Source: Ambulatory Visit | Attending: Family Medicine | Admitting: Family Medicine

## 2019-05-26 ENCOUNTER — Other Ambulatory Visit: Payer: Self-pay

## 2019-05-26 ENCOUNTER — Ambulatory Visit (INDEPENDENT_AMBULATORY_CARE_PROVIDER_SITE_OTHER): Payer: Medicaid Other | Admitting: Family Medicine

## 2019-05-26 DIAGNOSIS — Z113 Encounter for screening for infections with a predominantly sexual mode of transmission: Secondary | ICD-10-CM | POA: Diagnosis not present

## 2019-05-26 DIAGNOSIS — N898 Other specified noninflammatory disorders of vagina: Secondary | ICD-10-CM

## 2019-05-26 LAB — POCT WET PREP (WET MOUNT)
Clue Cells Wet Prep Whiff POC: POSITIVE
Trichomonas Wet Prep HPF POC: ABSENT
WBC, Wet Prep HPF POC: >20

## 2019-05-26 MED ORDER — METRONIDAZOLE 500 MG PO TABS: 500.0000 mg | ORAL_TABLET | Freq: Two times a day (BID) | ORAL | 0 refills | Status: DC

## 2019-05-26 MED ORDER — CLOTRIMAZOLE 1 % EX CREA: 1.0000 "application " | TOPICAL_CREAM | Freq: Two times a day (BID) | CUTANEOUS | 0 refills | Status: DC

## 2019-05-26 MED ORDER — FLUCONAZOLE 150 MG PO TABS: 150.0000 mg | ORAL_TABLET | Freq: Once | ORAL | 0 refills | Status: AC

## 2019-05-26 NOTE — Patient Instructions (Addendum)
It was great meeting you today!  We performed a wet prep today to evaluate for yeast, bacterial vaginosis, and trichomonas.  Your results are bacterial vaginosis and yeast.  For the bacterial vaginosis we will treat this with metronidazole 2 times a day for 7 days.  For the yeast you take a one-time medication called Diflucan 1 time.  If your symptoms are still present 3 days later can take a second dose.   We are also going to blood work for syphilis, HIV, and hepatitis C.  I will call you with these results to come back likely next Monday or Tuesday.

## 2019-05-27 LAB — CERVICOVAGINAL ANCILLARY ONLY
Chlamydia: NEGATIVE
Neisseria Gonorrhea: NEGATIVE

## 2019-05-29 ENCOUNTER — Telehealth: Payer: Self-pay | Admitting: Family Medicine

## 2019-05-29 NOTE — Telephone Encounter (Signed)
Please let the patient know that her gc/chlamydia came back negative  Guadalupe Dawn MD PGY-3 Family Medicine Resident

## 2019-05-30 NOTE — Assessment & Plan Note (Signed)
Drew gc/chlamydia, hiv, hep c, rpr. If positive will treat accordingly.

## 2019-05-30 NOTE — Assessment & Plan Note (Signed)
Wet prep positive for yeast infection and bv. Will treat with diflucan, and metronidazole.

## 2019-05-30 NOTE — Progress Notes (Signed)
   HPI 18 year old female who presents for std check. She had just gotten out of a relationship and states that she wants "to be checked for everything" She does endorse a white discharge and some vaginal itching. Of note the patient was seen in the [facility]U for a spontaneous abortion back on 8/18. Clarified with patient that she is no longer pregnant. She has not been sexually active since that time.  CC: std testing   ROS:   Review of Systems See HPI for ROS.   CC, SH/smoking status, and VS noted  Objective: LMP 02/26/2019  Gen: 18 year old female, well appearing, no acute distress CV: regular rate and rhythm, no m/r/g Resp: CTAB, no wheezes, non-labored Neuro: Alert and oriented, Speech clear, No gross deficits GU: small amount of white discharge noted.   Assessment and plan:  Vaginal discharge Wet prep positive for yeast infection and bv. Will treat with diflucan, and metronidazole.  Screen for STD (sexually transmitted disease) Drew gc/chlamydia, hiv, hep c, rpr. If positive will treat accordingly.    Orders Placed This Encounter  Procedures  . HIV antibody (with reflex)    Standing Status:   Future    Standing Expiration Date:   05/25/2020  . RPR    Standing Status:   Future    Standing Expiration Date:   05/25/2020  . Hepatitis c antibody (reflex)    Standing Status:   Future    Standing Expiration Date:   05/25/2020  . POCT Wet Prep [facility])    Meds ordered this encounter  Medications  . metroNIDAZOLE (FLAGYL) 500 MG tablet    Sig: Take 1 tablet (500 mg total) by mouth 2 (two) times daily.    Dispense:  14 tablet    Refill:  0  . DISCONTD: clotrimazole (LOTRIMIN) 1 % cream    Sig: Apply 1 application topically 2 (two) times daily.    Dispense:  30 g    Refill:  0  . fluconazole (DIFLUCAN) 150 MG tablet    Sig: Take 1 tablet (150 mg total) by mouth once for 1 dose.    Dispense:  1 tablet    Refill:  0    Guadalupe Dawn MD PGY-3 Family Medicine Resident   05/30/2019 8:25 AM

## 2019-05-31 ENCOUNTER — Telehealth: Payer: Self-pay | Admitting: Family Medicine

## 2019-05-31 NOTE — Telephone Encounter (Signed)
Patient would like to know the results of her labwork done last week please.  Thank you.

## 2019-05-31 NOTE — Telephone Encounter (Signed)
Patient informed of results. Mccayla Shimada,CMA  

## 2019-06-01 ENCOUNTER — Other Ambulatory Visit: Payer: Medicaid Other

## 2019-06-01 ENCOUNTER — Other Ambulatory Visit: Payer: Self-pay

## 2019-06-06 ENCOUNTER — Ambulatory Visit: Payer: Medicaid Other | Admitting: Family Medicine

## 2019-06-06 NOTE — Progress Notes (Deleted)
  Subjective:   Patient ID: Sheri Nunez    DOB: 05-Mar-2001, 18 y.o. female   MRN: [id number]  Sheri Nunez is a 18 y.o. female with a history of eczema here for   Mold exposure - ***  Review of Systems:  Per HPI.  Medications and smoking status reviewed.  Objective:   LMP 02/26/2019  Vitals and nursing note reviewed.  General: well nourished, well developed, in no acute distress with non-toxic appearance HEENT: normocephalic, atraumatic, moist mucous membranes Neck: supple, non-tender without lymphadenopathy CV: regular rate and rhythm without murmurs, rubs, or gallops, no lower extremity edema Lungs: clear to auscultation bilaterally with normal work of breathing Abdomen: soft, non-tender, non-distended, no masses or organomegaly palpable, normoactive bowel sounds Skin: warm, dry, no rashes or lesions Extremities: warm and well perfused, normal tone MSK: ROM grossly intact, gait normal Neuro: Alert and oriented, speech normal  Assessment & Plan:   No problem-specific Assessment & Plan notes found for this encounter.  No orders of the defined types were placed in this encounter.  No orders of the defined types were placed in this encounter.   Rory Percy, DO PGY-3, [facility] 06/06/2019 2:12 PM

## 2019-06-12 ENCOUNTER — Other Ambulatory Visit: Payer: Self-pay

## 2019-06-12 ENCOUNTER — Telehealth (INDEPENDENT_AMBULATORY_CARE_PROVIDER_SITE_OTHER): Payer: Medicaid Other | Admitting: Family Medicine

## 2019-06-12 DIAGNOSIS — R6889 Other general symptoms and signs: Secondary | ICD-10-CM

## 2019-06-12 DIAGNOSIS — Z20822 Contact with and (suspected) exposure to covid-19: Secondary | ICD-10-CM

## 2019-06-12 NOTE — Assessment & Plan Note (Signed)
Patient evaluated, sent for testing at [facility], home with instructions for home care and Quarantine. Instructed to seek further care if symptoms worsen.

## 2019-06-12 NOTE — Progress Notes (Signed)
[facility] Visit  Patient consented to have virtual visit. Method of visit: Telephone  Encounter participants: Patient: Sheri Nunez - located at home Provider: Gerlene Fee - located at home office  Chief Complaint: Positive COVID exposure  HPI:  Fiance with diarrhea and headache last week. Patient was feeling sick with similar symptoms 2 weeks ago. Without symptoms today.   COVID Drive-Up Test Referral Criteria  Patient age: 18 y.o.  Symptoms: No Symptoms  Underlying Conditions: No underlying conditions  Is the patient a first responder? No  Does the patient live or work in a high risk or high density environment: No  Is the patient a COVID convalescent patient who is 14-28 days symptom-free and interested in donating plasma for use as a therapeutic product? No     ROS: per HPI  Pertinent PMHx: None  Exam:  General: Sounds well, no acute distress.  Psych: normal affect  Assessment/Plan:  Suspected 2019 Novel Coronavirus Infection Patient evaluated, sent for testing at [facility], home with instructions for home care and Quarantine. Instructed to seek further care if symptoms worsen.       Time spent during visit with patient: 15 minutes

## 2019-07-24 ENCOUNTER — Ambulatory Visit: Payer: Medicaid Other

## 2019-08-13 ENCOUNTER — Encounter (HOSPITAL_COMMUNITY): Payer: Self-pay | Admitting: Emergency Medicine

## 2019-08-13 ENCOUNTER — Other Ambulatory Visit: Payer: Self-pay

## 2019-08-13 ENCOUNTER — Emergency Department (HOSPITAL_COMMUNITY)
Admission: EM | Admit: 2019-08-13 | Discharge: 2019-08-13 | Disposition: A | Discharge status: Home / Self Care | Payer: Medicaid Other | Attending: Emergency Medicine | Admitting: Emergency Medicine

## 2019-08-13 DIAGNOSIS — J020 Streptococcal pharyngitis: Secondary | ICD-10-CM | POA: Insufficient documentation

## 2019-08-13 DIAGNOSIS — J029 Acute pharyngitis, unspecified: Secondary | ICD-10-CM | POA: Diagnosis present

## 2019-08-13 DIAGNOSIS — Z20822 Contact with and (suspected) exposure to covid-19: Secondary | ICD-10-CM

## 2019-08-13 DIAGNOSIS — Z20828 Contact with and (suspected) exposure to other viral communicable diseases: Secondary | ICD-10-CM | POA: Diagnosis not present

## 2019-08-13 LAB — GROUP A STREP BY PCR: Group A Strep by PCR: DETECTED — AB

## 2019-08-13 MED ORDER — PENICILLIN G BENZATHINE 1200000 UNIT/2ML IM SUSP
1.2000 10*6.[IU] | Freq: Once | INTRAMUSCULAR | Status: AC
  Administered 2019-08-13: 1.2 10*6.[IU] via INTRAMUSCULAR
  Filled 2019-08-13: qty 2

## 2019-08-13 NOTE — ED Provider Notes (Signed)
[facility] EMERGENCY DEPARTMENT Provider Note   CSN: [id number] Arrival date & time: 08/13/19  2046     History   Chief Complaint Chief Complaint  Patient presents with   Sore Throat    HPI Sheri Nunez is a 18 y.o. female with no major medical history presents to the Emergency Department complaining of gradual, persistent, progressively worsening sore throat and headache onset approximately 3 days ago.  Patient reports her mother has strep throat.  No treatments prior to arrival.  No specific aggravating or alleviating factors.  She denies fever, chills, neck pain, neck stiffness, chest pain, cough, abdominal pain, nausea, vomiting, diarrhea.  Last menstrual cycle October 21, patient relates irregular cycles.  She is sexually active with one female partner.  She reports they have been intimate throughout the week and he potentially has Covid.     The history is provided by the patient, medical records and a significant other. No language interpreter was used.    Past Medical History:  Diagnosis Date   Infection    UTI   Medical history non-contributory     Patient Active Problem List   Diagnosis Date Noted   Suspected 2019 novel coronavirus infection 06/12/2019   Indication for care in labor or delivery 09/10/2018   Screen for STD (sexually transmitted disease) 03/31/2016   Vaginal discharge 04/27/2014   Eczema of face 11/11/2006    Past Surgical History:  Procedure Laterality Date   NO PAST SURGERIES       OB History    Gravida  2   Para  1   Term  1   Preterm      AB      Living  1     SAB      TAB      Ectopic      Multiple  0   Live Births  1            Home Medications    Prior to Admission medications   Medication Sig Start Date End Date Taking? Authorizing Provider  ibuprofen (ADVIL) 600 MG tablet Take 1 tablet (600 mg total) by mouth every 6 (six) hours as needed for cramping. 05/02/19   Donette Larry, CNM  metroNIDAZOLE (FLAGYL) 500 MG tablet Take 1 tablet (500 mg total) by mouth 2 (two) times daily. 05/26/19   Myrene Buddy, MD  Prenatal Vit-Fe Fumarate-FA (MULTIVITAMIN-PRENATAL) 27-0.8 MG TABS tablet Take 1 tablet by mouth daily at 12 noon. 01/28/18   Mayo, Allyn Kenner, MD    Family History Family History  Problem Relation Age of Onset   Healthy Mother    Healthy Father     Social History Social History   Tobacco Use   Smoking status: Never Smoker   Smokeless tobacco: Never Used  Substance Use Topics   Alcohol use: No   Drug use: No     Allergies   Patient has no known allergies.   Review of Systems Review of Systems  Constitutional: Negative for chills and fever.  HENT: Positive for sore throat. Negative for congestion.   Eyes: Negative for visual disturbance.  Respiratory: Negative for cough, chest tightness and shortness of breath.   Cardiovascular: Negative for chest pain.  Gastrointestinal: Negative for abdominal pain, nausea and vomiting.  Musculoskeletal: Negative for neck pain and neck stiffness.  Skin: Negative for rash.  Allergic/Immunologic: Negative for immunocompromised state.  Neurological: Positive for headaches.  Hematological: Negative for adenopathy.  Physical Exam Updated Vital Signs BP 122/75 (BP Location: Right Arm)    Pulse 99    Temp 98.7 F (37.1 C) (Oral)    Resp 16    Ht 5\' 8"  (1.727 m)    Wt 68 kg    LMP 07/12/2019 (Approximate)    SpO2 100%    Breastfeeding Unknown    BMI 22.81 kg/m   Physical Exam Vitals signs and nursing note reviewed.  Constitutional:      General: She is not in acute distress.    Appearance: She is not diaphoretic.  HENT:     Head: Normocephalic.     Mouth/Throat:     Mouth: Mucous membranes are moist.     Pharynx: Uvula midline. Posterior oropharyngeal erythema present. No pharyngeal swelling, oropharyngeal exudate or uvula swelling.     Tonsils: No tonsillar exudate or tonsillar  abscesses.  Eyes:     General: No scleral icterus.    Conjunctiva/sclera: Conjunctivae normal.  Neck:     Musculoskeletal: Normal range of motion. No neck rigidity.     Comments: Full range of motion without pain. Normal phonation.  Handling secretions without difficulty. Cardiovascular:     Rate and Rhythm: Normal rate and regular rhythm.     Pulses: Normal pulses.          Radial pulses are 2+ on the right side and 2+ on the left side.  Pulmonary:     Effort: No tachypnea, accessory muscle usage, prolonged expiration, respiratory distress or retractions.     Breath sounds: No stridor.     Comments: Equal chest rise. No increased work of breathing. Abdominal:     General: There is no distension.     Palpations: Abdomen is soft.     Tenderness: There is no abdominal tenderness. There is no guarding or rebound.  Musculoskeletal:     Comments: Moves all extremities equally and without difficulty.  Lymphadenopathy:     Cervical: No cervical adenopathy.  Skin:    General: Skin is warm and dry.     Capillary Refill: Capillary refill takes less than 2 seconds.  Neurological:     Mental Status: She is alert.     GCS: GCS eye subscore is 4. GCS verbal subscore is 5. GCS motor subscore is 6.     Comments: Speech is clear and goal oriented.  Psychiatric:        Mood and Affect: Mood normal.      ED Treatments / Results  Labs (all labs ordered are listed, but only abnormal results are displayed) Labs Reviewed  GROUP A STREP BY PCR - Abnormal; Notable for the following components:      Result Value   Group A Strep by PCR DETECTED (*)    All other components within normal limits  NOVEL CORONAVIRUS, NAA (HOSP ORDER, SEND-OUT TO REF LAB; TAT 18-24 HRS)    EKG None  Radiology No results found.  Procedures Procedures (including critical care time)  Medications Ordered in ED Medications  penicillin g benzathine (BICILLIN LA) 1200000 UNIT/2ML injection 1.2 Million Units (has  no administration in time range)     Initial Impression / Assessment and Plan / ED Course  I have reviewed the triage vital signs and the nursing notes.  Pertinent labs & imaging results that were available during my care of the patient were reviewed by me and considered in my medical decision making (see chart for details).        Pt with  sore throat and headache.  Strep test positive. Treated in the ED with PCN IM.  Pt does not appear dehydrated, however discussed importance of water rehydration. Presentation non concerning for PTA or RPA. No trismus or uvula deviation. Specific return precautions discussed. Pt able to drink water in ED without difficulty with intact air way. Recommended PCP follow up.   Patient additionally exposed to someone with Covid symptoms and requesting Covid test.  Outpatient testing sent and isolation discussed.    [place] N Pasqual was evaluated in Emergency Department on 08/13/2019 for the symptoms described in the history of present illness. She was evaluated in the context of the global COVID-19 pandemic, which necessitated consideration that the patient might be at risk for infection with the SARS-CoV-2 virus that causes COVID-19. Institutional protocols and algorithms that pertain to the evaluation of patients at risk for COVID-19 are in a state of rapid change based on information released by regulatory bodies including the CDC and federal and state organizations. These policies and algorithms were followed during the patient's care in the ED.    Final Clinical Impressions(s) / ED Diagnoses   Final diagnoses:  Strep pharyngitis  Person under investigation for COVID-19    ED Discharge Orders    None       Elhadj Girton, Boyd KerbsHannah, PA-C 08/13/19 2236    Tilden Fossaees, Elizabeth, MD 08/15/19 1254

## 2019-08-13 NOTE — ED Triage Notes (Signed)
Pt to ED with c/o sore throat and cough x;'s 3 days.  Pt st's her mom tested positive for covid 2 weeks ago

## 2019-08-13 NOTE — Discharge Instructions (Signed)
1. Medications: Alternate tylenol and ibuprofen for fever control, continue usual home medications 2. Treatment: rest, drink plenty of fluids, isolate for the next 10 days AND at least 3 days after complete resolution of symptoms 3. Follow Up: Please followup with your primary doctor if your symptoms are not improving after 10-14 days; Please return to the ER for high fevers, persistent vomiting, shortness of breath or other concerns.      Person Under Monitoring Name: Sheri Nunez  Location: [address]  [place] [address]   Infection Prevention Recommendations for Individuals Confirmed to have, or Being Evaluated for, 2019 Novel Coronavirus (COVID-19) Infection Who Receive Care at Home  Individuals who are confirmed to have, or are being evaluated for, COVID-19 should follow the prevention steps below until a healthcare provider or local or state health department says they can return to normal activities.  Stay home except to get medical care You should restrict activities outside your home, except for getting medical care. Do not go to work, school, or public areas, and do not use public transportation or taxis.  Call ahead before visiting your doctor Before your medical appointment, call the healthcare provider and tell them that you have, or are being evaluated for, COVID-19 infection. This will help the healthcare providers office take steps to keep other people from getting infected. Ask your healthcare provider to call the local or state health department.  Monitor your symptoms Seek prompt medical attention if your illness is worsening (e.g., difficulty breathing). Before going to your medical appointment, call the healthcare provider and tell them that you have, or are being evaluated for, COVID-19 infection. Ask your healthcare provider to call the local or state health department.  Wear a facemask You should wear a facemask that covers your nose and mouth  when you are in the same room with other people and when you visit a healthcare provider. People who live with or visit you should also wear a facemask while they are in the same room with you.  Separate yourself from other people in your home As much as possible, you should stay in a different room from other people in your home. Also, you should use a separate bathroom, if available.  Avoid sharing household items You should not share dishes, drinking glasses, cups, eating utensils, towels, bedding, or other items with other people in your home. After using these items, you should wash them thoroughly with soap and water.  Cover your coughs and sneezes Cover your mouth and nose with a tissue when you cough or sneeze, or you can cough or sneeze into your sleeve. Throw used tissues in a lined trash can, and immediately wash your hands with soap and water for at least 20 seconds or use an alcohol-based hand rub.  Wash your [facility] your hands often and thoroughly with soap and water for at least 20 seconds. You can use an alcohol-based hand sanitizer if soap and water are not available and if your hands are not visibly dirty. Avoid touching your eyes, nose, and mouth with unwashed hands.   Prevention Steps for Caregivers and Household Members of Individuals Confirmed to have, or Being Evaluated for, COVID-19 Infection Being Cared for in the Home  If you live with, or provide care at home for, a person confirmed to have, or being evaluated for, COVID-19 infection please follow these guidelines to prevent infection:  Follow healthcare providers instructions Make sure that you understand and can help the patient  follow any healthcare provider instructions for all care.  Provide for the patients basic needs You should help the patient with basic needs in the home and provide support for getting groceries, prescriptions, and other personal needs.  Monitor the patients symptoms If  they are getting sicker, call his or her medical provider and tell them that the patient has, or is being evaluated for, COVID-19 infection. This will help the healthcare providers office take steps to keep other people from getting infected. Ask the healthcare provider to call the local or state health department.  Limit the number of people who have contact with the patient If possible, have only one caregiver for the patient. Other household members should stay in another home or place of residence. If this is not possible, they should stay in another room, or be separated from the patient as much as possible. Use a separate bathroom, if available. Restrict visitors who do not have an essential need to be in the home.  Keep older adults, very young children, and other sick people away from the patient Keep older adults, very young children, and those who have compromised immune systems or chronic health conditions away from the patient. This includes people with chronic heart, lung, or kidney conditions, diabetes, and cancer.  Ensure good ventilation Make sure that shared spaces in the home have good air flow, such as from an air conditioner or an opened window, weather permitting.  Wash your hands often Wash your hands often and thoroughly with soap and water for at least 20 seconds. You can use an alcohol based hand sanitizer if soap and water are not available and if your hands are not visibly dirty. Avoid touching your eyes, nose, and mouth with unwashed hands. Use disposable paper towels to dry your hands. If not available, use dedicated cloth towels and replace them when they become wet.  Wear a facemask and gloves Wear a disposable facemask at all times in the room and gloves when you touch or have contact with the patients blood, body fluids, and/or secretions or excretions, such as sweat, saliva, sputum, nasal mucus, vomit, urine, or feces.  Ensure the mask fits over your nose  and mouth tightly, and do not touch it during use. Throw out disposable facemasks and gloves after using them. Do not reuse. Wash your hands immediately after removing your facemask and gloves. If your personal clothing becomes contaminated, carefully remove clothing and launder. Wash your hands after handling contaminated clothing. Place all used disposable facemasks, gloves, and other waste in a lined container before disposing them with other household waste. Remove gloves and wash your hands immediately after handling these items.  Do not share dishes, glasses, or other household items with the patient Avoid sharing household items. You should not share dishes, drinking glasses, cups, eating utensils, towels, bedding, or other items with a patient who is confirmed to have, or being evaluated for, COVID-19 infection. After the person uses these items, you should wash them thoroughly with soap and water.  Wash laundry thoroughly Immediately remove and wash clothes or bedding that have blood, body fluids, and/or secretions or excretions, such as sweat, saliva, sputum, nasal mucus, vomit, urine, or feces, on them. Wear gloves when handling laundry from the patient. Read and follow directions on labels of laundry or clothing items and detergent. In general, wash and dry with the warmest temperatures recommended on the label.  Clean all areas the individual has used often Clean all touchable surfaces, such as  counters, tabletops, doorknobs, bathroom fixtures, toilets, phones, keyboards, tablets, and bedside tables, every day. Also, clean any surfaces that may have blood, body fluids, and/or secretions or excretions on them. Wear gloves when cleaning surfaces the patient has come in contact with. Use a diluted bleach solution (e.g., dilute bleach with 1 part bleach and 10 parts water) or a household disinfectant with a label that says EPA-registered for coronaviruses. To make a bleach solution at  home, add 1 tablespoon of bleach to 1 quart (4 cups) of water. For a larger supply, add  cup of bleach to 1 gallon (16 cups) of water. Read labels of cleaning products and follow recommendations provided on product labels. Labels contain instructions for safe and effective use of the cleaning product including precautions you should take when applying the product, such as wearing gloves or eye protection and making sure you have good ventilation during use of the product. Remove gloves and wash hands immediately after cleaning.  Monitor yourself for signs and symptoms of illness Caregivers and household members are considered close contacts, should monitor their health, and will be asked to limit movement outside of the home to the extent possible. Follow the monitoring steps for close contacts listed on the symptom monitoring form.   ? If you have additional questions, contact your local health department or call the epidemiologist on call at [phone] (available 24/7). ? This guidance is subject to change. For the most up-to-date guidance from [facility], please refer to their website: TripMetro.huhttps://www.cdc.gov/coronavirus/2019-ncov/hcp/guidance-prevent-spread.html

## 2019-08-15 LAB — NOVEL CORONAVIRUS, NAA (HOSP ORDER, SEND-OUT TO REF LAB; TAT 18-24 HRS): SARS-CoV-2, NAA: NOT DETECTED

## 2019-10-12 ENCOUNTER — Ambulatory Visit (INDEPENDENT_AMBULATORY_CARE_PROVIDER_SITE_OTHER): Payer: Medicaid Other | Admitting: Family Medicine

## 2019-10-12 ENCOUNTER — Ambulatory Visit: Payer: Medicaid Other

## 2019-10-12 ENCOUNTER — Encounter: Payer: Self-pay | Admitting: Family Medicine

## 2019-10-12 ENCOUNTER — Other Ambulatory Visit (HOSPITAL_COMMUNITY)
Admission: RE | Admit: 2019-10-12 | Discharge: 2019-10-12 | Disposition: A | Discharge status: Home / Self Care | Payer: Medicaid Other | Source: Ambulatory Visit | Attending: Family Medicine | Admitting: Family Medicine

## 2019-10-12 ENCOUNTER — Other Ambulatory Visit: Payer: Self-pay

## 2019-10-12 VITALS — BP 104/60 | HR 91 | Wt 147.0 lb

## 2019-10-12 DIAGNOSIS — N898 Other specified noninflammatory disorders of vagina: Secondary | ICD-10-CM | POA: Diagnosis not present

## 2019-10-12 DIAGNOSIS — Z113 Encounter for screening for infections with a predominantly sexual mode of transmission: Secondary | ICD-10-CM

## 2019-10-12 DIAGNOSIS — N912 Amenorrhea, unspecified: Secondary | ICD-10-CM

## 2019-10-12 LAB — POCT URINE PREGNANCY: Preg Test, Ur: NEGATIVE

## 2019-10-12 MED ORDER — METRONIDAZOLE 500 MG PO TABS: 500.0000 mg | ORAL_TABLET | Freq: Two times a day (BID) | ORAL | 0 refills | Status: DC

## 2019-10-12 NOTE — Assessment & Plan Note (Signed)
STI testing today w results pending.

## 2019-10-12 NOTE — Assessment & Plan Note (Addendum)
Wet prep and STI testing Positive on wet prep for BV and will treat Will notify her of other STI tests

## 2019-10-12 NOTE — Progress Notes (Signed)
    CHIEF COMPLAINT / HPI: Just discharged vaginal area.  Unusual smell.  No pelvic cramping.  No unusual vaginal bleeding.   PERTINENT  PMH / PSH: I have reviewed the patient's medications, allergies, past medical and surgical history, smoking status and updated in the EMR as appropriate.   OBJECTIVE:  BP 104/60   Pulse 91   Wt 147 lb (66.7 kg)   LMP 09/08/2019   SpO2 99%   BMI 22.35 kg/m   GU area was normal.  Small amount of discharge noted in the vault. ASSESSMENT / PLAN:   Vaginal discharge Wet prep and STI testing Positive on wet prep for BV and will treat Will notify her of other STI tests  Screen for STD (sexually transmitted disease) STI testing today w results pending.   Denny Levy MD

## 2019-10-12 NOTE — Patient Instructions (Signed)
Bacterial Vaginosis  Bacterial vaginosis is a vaginal infection that occurs when the normal balance of bacteria in the vagina is disrupted. It results from an overgrowth of certain bacteria. This is the most common vaginal infection among women ages 15-44. Because bacterial vaginosis increases your risk for STIs (sexually transmitted infections), getting treated can help reduce your risk for chlamydia, gonorrhea, herpes, and HIV (human immunodeficiency virus). Treatment is also important for preventing complications in pregnant women, because this condition can cause an early (premature) delivery. What are the causes? This condition is caused by an increase in harmful bacteria that are normally present in small amounts in the vagina. However, the reason that the condition develops is not fully understood. What increases the risk? The following factors may make you more likely to develop this condition:  Having a new sexual partner or multiple sexual partners.  Having unprotected sex.  Douching.  Having an intrauterine device (IUD).  Smoking.  Drug and alcohol abuse.  Taking certain antibiotic medicines.  Being pregnant. You cannot get bacterial vaginosis from toilet seats, bedding, swimming pools, or contact with objects around you. What are the signs or symptoms? Symptoms of this condition include:  Grey or white vaginal discharge. The discharge can also be watery or foamy.  A fish-like odor with discharge, especially after sexual intercourse or during menstruation.  Itching in and around the vagina.  Burning or pain with urination. Some women with bacterial vaginosis have no signs or symptoms. How is this diagnosed? This condition is diagnosed based on:  Your medical history.  A physical exam of the vagina.  Testing a sample of vaginal fluid under a microscope to look for a large amount of bad bacteria or abnormal cells. Your health care provider may use a cotton swab or  a small wooden spatula to collect the sample. How is this treated? This condition is treated with antibiotics. These may be given as a pill, a vaginal cream, or a medicine that is put into the vagina (suppository). If the condition comes back after treatment, a second round of antibiotics may be needed. Follow these instructions at home: Medicines  Take over-the-counter and prescription medicines only as told by your health care provider.  Take or use your antibiotic as told by your health care provider. Do not stop taking or using the antibiotic even if you start to feel better. General instructions  If you have a female sexual partner, tell her that you have a vaginal infection. She should see her health care provider and be treated if she has symptoms. If you have a female sexual partner, he does not need treatment.  During treatment: ? Avoid sexual activity until you finish treatment. ? Do not douche. ? Avoid alcohol as directed by your health care provider. ? Avoid breastfeeding as directed by your health care provider.  Drink enough water and fluids to keep your urine clear or pale yellow.  Keep the area around your vagina and rectum clean. ? Wash the area daily with warm water. ? Wipe yourself from front to back after using the toilet.  Keep all follow-up visits as told by your health care provider. This is important. How is this prevented?  Do not douche.  Wash the outside of your vagina with warm water only.  Use protection when having sex. This includes latex condoms and dental dams.  Limit how many sexual partners you have. To help prevent bacterial vaginosis, it is best to have sex with just one partner (  monogamous).  Make sure you and your sexual partner are tested for STIs.  Wear cotton or cotton-lined underwear.  Avoid wearing tight pants and pantyhose, especially during summer.  Limit the amount of alcohol that you drink.  Do not use any products that contain  nicotine or tobacco, such as cigarettes and e-cigarettes. If you need help quitting, ask your health care provider.  Do not use illegal drugs. Where to find more information  Centers for Disease Control and Prevention: www.cdc.gov/std  American Sexual Health Association (ASHA): www.ashastd.org  [place]. Department of Health and Human Services, Office on Women's Health: www.womenshealth.gov/ or https://www.womenshealth.gov/a-z-topics/bacterial-vaginosis Contact a health care provider if:  Your symptoms do not improve, even after treatment.  You have more discharge or pain when urinating.  You have a fever.  You have pain in your abdomen.  You have pain during sex.  You have vaginal bleeding between periods. Summary  Bacterial vaginosis is a vaginal infection that occurs when the normal balance of bacteria in the vagina is disrupted.  Because bacterial vaginosis increases your risk for STIs (sexually transmitted infections), getting treated can help reduce your risk for chlamydia, gonorrhea, herpes, and HIV (human immunodeficiency virus). Treatment is also important for preventing complications in pregnant women, because the condition can cause an early (premature) delivery.  This condition is treated with antibiotic medicines. These may be given as a pill, a vaginal cream, or a medicine that is put into the vagina (suppository). This information is not intended to replace advice given to you by your health care provider. Make sure you discuss any questions you have with your health care provider. Document Revised: 08/13/2017 Document Reviewed: 05/16/2016 Elsevier Patient Education  2020 [employer]  

## 2019-10-13 LAB — HIV ANTIBODY (ROUTINE TESTING W REFLEX): HIV Screen 4th Generation wRfx: NONREACTIVE

## 2019-10-13 LAB — CERVICOVAGINAL ANCILLARY ONLY
Chlamydia: NEGATIVE
Comment: NEGATIVE
Comment: NORMAL
Neisseria Gonorrhea: NEGATIVE

## 2019-10-13 LAB — HCV COMMENT:

## 2019-10-13 LAB — RPR: RPR Ser Ql: NONREACTIVE

## 2019-10-13 LAB — HEPATITIS C ANTIBODY (REFLEX): HCV Ab: <0.1 s/co ratio (ref 0.0–0.9)

## 2019-10-18 ENCOUNTER — Encounter: Payer: Self-pay | Admitting: Family Medicine

## 2019-11-03 ENCOUNTER — Other Ambulatory Visit: Payer: Self-pay

## 2019-11-03 ENCOUNTER — Encounter (HOSPITAL_COMMUNITY): Payer: Self-pay | Admitting: Emergency Medicine

## 2019-11-03 DIAGNOSIS — J029 Acute pharyngitis, unspecified: Secondary | ICD-10-CM | POA: Insufficient documentation

## 2019-11-03 MED ORDER — ACETAMINOPHEN 325 MG PO TABS
650.0000 mg | ORAL_TABLET | Freq: Once | ORAL | Status: AC | PRN
  Administered 2019-11-04: 01:00:00 650 mg via ORAL
  Filled 2019-11-03: qty 2

## 2019-11-04 ENCOUNTER — Other Ambulatory Visit: Payer: Self-pay

## 2019-11-04 ENCOUNTER — Encounter (HOSPITAL_COMMUNITY): Payer: Self-pay | Admitting: Emergency Medicine

## 2019-11-04 ENCOUNTER — Emergency Department (HOSPITAL_COMMUNITY)
Admission: EM | Admit: 2019-11-04 | Discharge: 2019-11-04 | Disposition: A | Discharge status: Home / Self Care | Payer: Medicaid Other | Attending: Emergency Medicine | Admitting: Emergency Medicine

## 2019-11-04 DIAGNOSIS — J029 Acute pharyngitis, unspecified: Secondary | ICD-10-CM

## 2019-11-04 HISTORY — DX: Urinary tract infection, site not specified: N39.0

## 2019-11-04 LAB — GROUP A STREP BY PCR: Group A Strep by PCR: NOT DETECTED

## 2019-11-04 MED ORDER — LANSOPRAZOLE 15 MG PO CPDR: 15.0000 mg | DELAYED_RELEASE_CAPSULE | Freq: Every day | ORAL | 0 refills | Status: DC

## 2019-11-04 MED ORDER — PANTOPRAZOLE SODIUM 40 MG PO TBEC
40.0000 mg | DELAYED_RELEASE_TABLET | Freq: Once | ORAL | Status: AC
  Administered 2019-11-04: 40 mg via ORAL
  Filled 2019-11-04: qty 1

## 2019-11-04 NOTE — ED Triage Notes (Signed)
Patient complaining of a sore throat that started two days ago.

## 2019-11-04 NOTE — ED Provider Notes (Signed)
[facility] DEPT Provider Note: Georgena Spurling, MD, FACEP  CSN: [id number] MRN: [id number] ARRIVAL: 11/03/19 at 2349 ROOM: [place]  Sore Throat   HISTORY OF PRESENT ILLNESS  11/04/19 12:59 AM Sheri Nunez is a 19 y.o. female who complains of a sore throat for 2 days.  She rates her pain as a 7 out of 10, worse with swallowing.  She describes the sensation as feeling like her throat is swollen.  She has some anterior cervical lymphadenopathy but denies fever.  Her sore throat is worse in the morning.  She was given Tylenol on arrival with partial improvement.   Past Medical History:  Diagnosis Date  . UTI (urinary tract infection)     Past Surgical History:  Procedure Laterality Date  . NO PAST SURGERIES      Family History  Problem Relation Age of Onset  . Healthy Mother   . Healthy Father     Social History   Tobacco Use  . Smoking status: Never Smoker  . Smokeless tobacco: Never Used  Substance Use Topics  . Alcohol use: No  . Drug use: No    Prior to Admission medications   Medication Sig Start Date End Date Taking? Authorizing Provider  lansoprazole (PREVACID) 15 MG capsule Take 1 capsule (15 mg total) by mouth daily. 11/04/19   Clemons Salvucci, Jenny Reichmann, MD    Allergies Patient has no known allergies.   REVIEW OF SYSTEMS  Negative except as noted here or in the History of Present Illness.   PHYSICAL EXAMINATION  Initial Vital Signs Blood pressure 135/87, pulse 77, temperature 98.7 F (37.1 C), temperature source Oral, resp. rate 16, height 5\' 8"  (1.727 m), weight 65.8 kg, last menstrual period 10/19/2019, SpO2 100 %, unknown if currently breastfeeding.  Examination General: Well-developed, well-nourished female in no acute distress; appearance consistent with age of record HENT: normocephalic; atraumatic; no pharyngeal erythema, edema or exudate; uvula midline; no dysphonia; no stridor Eyes: pupils equal, round and reactive to light;  extraocular muscles intact Neck: supple; mild anterior cervical lymphadenopathy Heart: regular rate and rhythm Lungs: clear to auscultation bilaterally Abdomen: soft; nondistended; nontender; bowel sounds present Extremities: No deformity; full range of motion Neurologic: Awake, alert and oriented; motor function intact in all extremities and symmetric; no facial droop Skin: Warm and dry Psychiatric: Normal mood and affect   RESULTS  Summary of this visit's results, reviewed and interpreted by myself:   EKG Interpretation  Date/Time:    Ventricular Rate:    PR Interval:    QRS Duration:   QT Interval:    QTC Calculation:   R Axis:     Text Interpretation:        Laboratory Studies: Results for orders placed or performed during the hospital encounter of 11/04/19 (from the past 24 hour(s))  Group A Strep by PCR     Status: None   Collection Time: 11/04/19 12:42 AM   Specimen: Throat; Sterile Swab  Result Value Ref Range   Group A Strep by PCR NOT DETECTED NOT DETECTED   Imaging Studies: No results found.  ED COURSE and MDM  Nursing notes, initial and subsequent vitals signs, including pulse oximetry, reviewed and interpreted by myself.  Vitals:   11/03/19 2357 11/04/19 0003 11/04/19 0045 11/04/19 0145  BP: 135/87  (!) 127/98 104/70  Pulse: 77  82 74  Resp: 16  19   Temp: 98.7 F (37.1 C)     TempSrc: Oral  SpO2: 100%  100% 95%  Weight:  65.8 kg    Height:  5\' 8"  (1.727 m)     Medications  pantoprazole (PROTONIX) EC tablet 40 mg (has no administration in time range)  acetaminophen (TYLENOL) tablet 650 mg (650 mg Oral Given 11/04/19 0043)   The patient strep test is negative.  I suspect her pharyngitis is either viral or could be due to GERD given that it is worse in the mornings.  We will try her on a brief course of PPI.   PROCEDURES  Procedures   ED DIAGNOSES     ICD-10-CM   1. Pharyngitis, unspecified etiology  J02.9        Soua Caltagirone,  MD 11/04/19 [phone]

## 2019-11-16 ENCOUNTER — Other Ambulatory Visit (HOSPITAL_COMMUNITY)
Admission: RE | Admit: 2019-11-16 | Discharge: 2019-11-16 | Disposition: A | Discharge status: Home / Self Care | Payer: Medicaid Other | Source: Ambulatory Visit | Attending: Family Medicine | Admitting: Family Medicine

## 2019-11-16 ENCOUNTER — Other Ambulatory Visit: Payer: Self-pay

## 2019-11-16 ENCOUNTER — Ambulatory Visit (INDEPENDENT_AMBULATORY_CARE_PROVIDER_SITE_OTHER): Payer: Medicaid Other | Admitting: Family Medicine

## 2019-11-16 VITALS — BP 110/64 | HR 84

## 2019-11-16 DIAGNOSIS — Z114 Encounter for screening for human immunodeficiency virus [HIV]: Secondary | ICD-10-CM

## 2019-11-16 DIAGNOSIS — N898 Other specified noninflammatory disorders of vagina: Secondary | ICD-10-CM | POA: Diagnosis not present

## 2019-11-16 NOTE — Progress Notes (Signed)
Informed patient that we would have to cancel wet prep due to insufficient sample.  We will add trich, yeast and BV to aptima swab to check for these.  Patient voiced understanding.  Advised to try and gentle wash to help relieve irritation until we get results.  Only to clean the outside of the vaginal area.  Antiono Ettinger,CMA

## 2019-11-17 ENCOUNTER — Other Ambulatory Visit (HOSPITAL_COMMUNITY)
Admission: RE | Admit: 2019-11-17 | Discharge: 2019-11-17 | Disposition: A | Discharge status: Home / Self Care | Payer: Medicaid Other | Source: Ambulatory Visit | Attending: Family Medicine | Admitting: Family Medicine

## 2019-11-17 ENCOUNTER — Other Ambulatory Visit: Payer: Self-pay

## 2019-11-17 ENCOUNTER — Ambulatory Visit (INDEPENDENT_AMBULATORY_CARE_PROVIDER_SITE_OTHER): Payer: Medicaid Other | Admitting: Family Medicine

## 2019-11-17 VITALS — BP 110/64 | HR 84 | Wt 145.0 lb

## 2019-11-17 DIAGNOSIS — N898 Other specified noninflammatory disorders of vagina: Secondary | ICD-10-CM | POA: Diagnosis not present

## 2019-11-17 DIAGNOSIS — B9689 Other specified bacterial agents as the cause of diseases classified elsewhere: Secondary | ICD-10-CM

## 2019-11-17 DIAGNOSIS — B379 Candidiasis, unspecified: Secondary | ICD-10-CM | POA: Diagnosis not present

## 2019-11-17 DIAGNOSIS — N760 Acute vaginitis: Secondary | ICD-10-CM

## 2019-11-17 LAB — POCT WET PREP (WET MOUNT)
Clue Cells Wet Prep Whiff POC: POSITIVE
Trichomonas Wet Prep HPF POC: ABSENT

## 2019-11-17 LAB — CERVICOVAGINAL ANCILLARY ONLY
Bacterial Vaginitis (gardnerella): NEGATIVE
Candida Glabrata: NEGATIVE
Candida Vaginitis: POSITIVE — AB
Chlamydia: NEGATIVE
Comment: NEGATIVE
Comment: NEGATIVE
Comment: NEGATIVE
Comment: NEGATIVE
Comment: NEGATIVE
Comment: NORMAL
Neisseria Gonorrhea: NEGATIVE
Trichomonas: NEGATIVE

## 2019-11-17 LAB — RPR: RPR Ser Ql: NONREACTIVE

## 2019-11-17 LAB — HIV ANTIBODY (ROUTINE TESTING W REFLEX): HIV Screen 4th Generation wRfx: NONREACTIVE

## 2019-11-17 MED ORDER — METRONIDAZOLE 500 MG PO TABS: 500.0000 mg | ORAL_TABLET | Freq: Two times a day (BID) | ORAL | 0 refills | Status: AC

## 2019-11-17 MED ORDER — FLUCONAZOLE 150 MG PO TABS: ORAL_TABLET | ORAL | 0 refills | Status: DC

## 2019-11-17 NOTE — Progress Notes (Signed)
   SUBJECTIVE:   CHIEF COMPLAINT / HPI:  Patient returning to clinic today for an adequate vaginal swab for wet prep yesterday.  She reports that she has continued to have symptoms of vaginal swelling, irritation, itching.  She does have some pain when sitting down.  She reports mild dysuria.  She denies any fevers, chills, dyspareunia.  PERTINENT  PMH / PSH: Currently sexually active  OBJECTIVE:   BP 110/64   Pulse 84   Wt 145 lb (65.8 kg)   LMP 10/13/2019   SpO2 99%   BMI 22.05 kg/m   General: Well-appearing female, no acute distress GU: External vulva and vagina mildly swollen but without any obvious lesion or rash.  Patient has some mild tenderness to palpation of vulva.  Creamy white discharge appreciated.  No obvious odor.  Parous cervix with ectropion approximately half centimeter in radius.  No cervical motion tenderness, masses or gross abnormalities appreciated during bimanual exam.  ASSESSMENT/PLAN:   Vaginal discharge Patient's wet prep consistent with bacterial vaginosis and yeast infection.  Provided metronidazole oral antibiotic course and 2 tablets of fluconazole.  Patient directed to take 1 tablet of fluconazole today.  If she has any symptoms after completing metronidazole course, she should take a second tablet.  If no improvement, patient to return to care.    Melene Plan, MD [facility]

## 2019-11-17 NOTE — Assessment & Plan Note (Signed)
Patient's wet prep consistent with bacterial vaginosis and yeast infection.  Provided metronidazole oral antibiotic course and 2 tablets of fluconazole.  Patient directed to take 1 tablet of fluconazole today.  If she has any symptoms after completing metronidazole course, she should take a second tablet.  If no improvement, patient to return to care.

## 2019-11-19 NOTE — Assessment & Plan Note (Signed)
Vaginal discharge -Wet prep -STI screening with GC chlamydia, HIV, RPR -Treat pending results

## 2019-11-19 NOTE — Progress Notes (Signed)
    SUBJECTIVE:   CHIEF COMPLAINT / HPI:   VAGINAL DISCHARGE  Having vaginal irritation and discharge for 3 days. Discharge consistency: white, thin Medications tried: None  Recent antibiotic use: None  sex in last month: Yes Possible STD exposure: Unknown  Symptoms Fever: n Dysuria:n Vaginal bleeding: n Abdomen or Pelvic pain: n Back pain: n Genital sores or ulcers:n Rash: n Pain during sex: n Missed menstrual period: n, currently on her menstrual cycle  ROS see HPI Smoking Status noted    PERTINENT  PMH / PSH:  Noncontributory  OBJECTIVE:   Vitals:   11/16/19 1354  BP: 110/64  Pulse: 84  SpO2: 98%   Gen: NAD, resting comfortably Pulm: NWOB,  GI: Soft, Nontender, Nondistended. GU: No vaginal lesions, no CMT, bimanual exam without palpated masses MSK: no edema, cyanosis, or clubbing noted Skin: warm, dry Neuro: grossly normal, moves all extremities Psych: Normal affect and thought content Chaperoned by Chase Picket  ASSESSMENT/PLAN:   No problem-specific Assessment & Plan notes found for this encounter.     Garnette Gunner, MD [facility]

## 2019-11-20 LAB — CERVICOVAGINAL ANCILLARY ONLY
Chlamydia: NEGATIVE
Comment: NEGATIVE
Comment: NORMAL
Neisseria Gonorrhea: NEGATIVE

## 2019-12-29 ENCOUNTER — Other Ambulatory Visit (HOSPITAL_COMMUNITY)
Admission: RE | Admit: 2019-12-29 | Discharge: 2019-12-29 | Disposition: A | Discharge status: Home / Self Care | Payer: Medicaid Other | Source: Ambulatory Visit | Attending: Family Medicine | Admitting: Family Medicine

## 2019-12-29 ENCOUNTER — Ambulatory Visit (INDEPENDENT_AMBULATORY_CARE_PROVIDER_SITE_OTHER): Payer: Medicaid Other | Admitting: Family Medicine

## 2019-12-29 ENCOUNTER — Other Ambulatory Visit: Payer: Self-pay

## 2019-12-29 ENCOUNTER — Encounter: Payer: Self-pay | Admitting: Family Medicine

## 2019-12-29 VITALS — BP 112/68 | HR 100 | Ht 68.0 in | Wt 148.0 lb

## 2019-12-29 DIAGNOSIS — N898 Other specified noninflammatory disorders of vagina: Secondary | ICD-10-CM | POA: Diagnosis not present

## 2019-12-29 DIAGNOSIS — Z113 Encounter for screening for infections with a predominantly sexual mode of transmission: Secondary | ICD-10-CM | POA: Diagnosis not present

## 2019-12-29 DIAGNOSIS — Z7689 Persons encountering health services in other specified circumstances: Secondary | ICD-10-CM

## 2019-12-29 LAB — POCT WET PREP (WET MOUNT)
Clue Cells Wet Prep Whiff POC: NEGATIVE
Trichomonas Wet Prep HPF POC: ABSENT

## 2019-12-29 MED ORDER — FLUCONAZOLE 150 MG PO TABS: 150.0000 mg | ORAL_TABLET | Freq: Once | ORAL | 0 refills | Status: AC

## 2019-12-29 MED ORDER — METRONIDAZOLE 500 MG PO TABS: 500.0000 mg | ORAL_TABLET | Freq: Two times a day (BID) | ORAL | 0 refills | Status: AC

## 2019-12-29 NOTE — Assessment & Plan Note (Signed)
Wet prep completed during this visit and was positive for yeast and BV. I called and discussed her result with her. She requested that her med be escribed to [facility] at [address]. F/U with PCP soon if symptoms persists or reoccurs. She agreed with the plan.

## 2019-12-29 NOTE — Patient Instructions (Signed)
How do I stop recurring yeast infections? This includes: Keep things clean. ... Choose the right underwear. ... After using the bathroom, wipe from front to back to avoid spreading yeast or bacteria between your anus, urinary tract, or vagina. Don't wear swimsuits longer than necessary. ... Don't wear tight clothes. ... Change tampons and pads regularly.

## 2019-12-29 NOTE — Assessment & Plan Note (Addendum)
Poor compliance with OCP. LARC discussed and I offered scheduling today.  However, she prefers to call back once she checks her work schedule. Regular condom use discussed.

## 2019-12-29 NOTE — Assessment & Plan Note (Signed)
Gonorrhea and chlamydia checked today. She had a recent HIV and RPR test 1 month ago which were negative. I will not recheck. Use of condoms discussed to prevent STD and pregnancy. She agreed with the plan.

## 2019-12-29 NOTE — Progress Notes (Signed)
    SUBJECTIVE:   CHIEF COMPLAINT / HPI:   Vaginal Discharge The patient's primary symptoms include vaginal discharge. The patient's pertinent negatives include no genital itching, genital lesions or pelvic pain. Primary symptoms comment: Started a week ago after sex. Symptoms usually is preceded by sex.. This is a new problem. The current episode started in the past 7 days. The problem occurs constantly. The problem has been unchanged. Pregnant now: LMP at the beginning of April. She is on OCP but inconsistent. Pertinent negatives include no abdominal pain, dysuria, frequency or vomiting. The vaginal discharge was milky. There has been no bleeding. The symptoms are aggravated by intercourse. She has tried nothing for the symptoms. She is sexually active. It is unknown whether or not her partner has an STD. She uses oral contraceptives for contraception.  Contraception management/STD risk:Inconsistent with OCP. She will like to switch to IUC/Mirena. Occasionally uses condoms. Will like STD check today.   PERTINENT  PMH / PSH: Reviewed PMX  OBJECTIVE:   BP 112/68   Pulse 100   Ht 5\' 8"  (1.727 m)   Wt 148 lb (67.1 kg)   LMP 12/14/2019   SpO2 98%   BMI 22.50 kg/m    Physical Exam Vitals and nursing note reviewed. Exam conducted with a chaperone present 02/13/2020 [place]).  Cardiovascular:     Rate and Rhythm: Normal rate and regular rhythm.     Pulses: Normal pulses.     Heart sounds: Normal heart sounds. No murmur.  Pulmonary:     Effort: Pulmonary effort is normal. No respiratory distress.     Breath sounds: Normal breath sounds. No wheezing.  Abdominal:     General: Abdomen is flat. Bowel sounds are normal. There is no distension.     Palpations: Abdomen is soft. There is no mass.  Genitourinary:    Vagina: Vaginal discharge present. No erythema, tenderness or bleeding.     Cervix: Discharge present. No friability or cervical bleeding.      ASSESSMENT/PLAN:   Vaginal  discharge Wet prep completed during this visit and was positive for yeast and BV. I called and discussed her result with her. She requested that [facility] [address]. F/U with PCP soon if symptoms persists or reoccurs. She agreed with the plan.  Contraception management Poor compliance with OCP. LARC discussed and I offered scheduling today.  However, she prefers to call back once she checks her work schedule. Regular condom use discussed. Note: Urine pregnancy was ordered for this visit. Unfortunately, she did not give her urine prior to leaving. LMP was 2 weeks ago. May return soon if she does not see are period on time. I called and informed her about this. She agreed with the plan.  Encounter for assessment of STD exposure Gonorrhea and chlamydia checked today. She had a recent HIV and RPR test 1 month ago which were negative. I will not recheck. Use of condoms discussed to prevent STD and pregnancy. She agreed with the plan.     Vasto, MD [facility]

## 2020-01-01 LAB — CERVICOVAGINAL ANCILLARY ONLY
Chlamydia: NEGATIVE
Comment: NEGATIVE
Comment: NEGATIVE
Comment: NORMAL
Neisseria Gonorrhea: NEGATIVE
Trichomonas: NEGATIVE

## 2020-03-31 ENCOUNTER — Inpatient Hospital Stay (HOSPITAL_COMMUNITY): Payer: Medicaid Other

## 2020-03-31 ENCOUNTER — Encounter (HOSPITAL_COMMUNITY): Payer: Self-pay | Admitting: Obstetrics & Gynecology

## 2020-03-31 ENCOUNTER — Other Ambulatory Visit: Payer: Self-pay

## 2020-03-31 ENCOUNTER — Inpatient Hospital Stay (HOSPITAL_COMMUNITY)
Admission: AD | Admit: 2020-03-31 | Discharge: 2020-03-31 | Disposition: A | Discharge status: Home / Self Care | Payer: Medicaid Other | Attending: Obstetrics & Gynecology | Admitting: Obstetrics & Gynecology

## 2020-03-31 DIAGNOSIS — Z3A01 Less than 8 weeks gestation of pregnancy: Secondary | ICD-10-CM | POA: Diagnosis not present

## 2020-03-31 DIAGNOSIS — N83202 Unspecified ovarian cyst, left side: Secondary | ICD-10-CM | POA: Diagnosis not present

## 2020-03-31 DIAGNOSIS — O26891 Other specified pregnancy related conditions, first trimester: Secondary | ICD-10-CM | POA: Insufficient documentation

## 2020-03-31 DIAGNOSIS — O26899 Other specified pregnancy related conditions, unspecified trimester: Secondary | ICD-10-CM

## 2020-03-31 DIAGNOSIS — R102 Pelvic and perineal pain: Secondary | ICD-10-CM | POA: Insufficient documentation

## 2020-03-31 DIAGNOSIS — O418X10 Other specified disorders of amniotic fluid and membranes, first trimester, not applicable or unspecified: Secondary | ICD-10-CM | POA: Diagnosis not present

## 2020-03-31 DIAGNOSIS — R112 Nausea with vomiting, unspecified: Secondary | ICD-10-CM | POA: Insufficient documentation

## 2020-03-31 DIAGNOSIS — Z3A08 8 weeks gestation of pregnancy: Secondary | ICD-10-CM | POA: Diagnosis not present

## 2020-03-31 DIAGNOSIS — O468X1 Other antepartum hemorrhage, first trimester: Secondary | ICD-10-CM

## 2020-03-31 DIAGNOSIS — O208 Other hemorrhage in early pregnancy: Secondary | ICD-10-CM | POA: Diagnosis not present

## 2020-03-31 DIAGNOSIS — R109 Unspecified abdominal pain: Secondary | ICD-10-CM | POA: Insufficient documentation

## 2020-03-31 LAB — CBC
HCT: 34.2 % — ABNORMAL LOW (ref 36.0–46.0)
Hemoglobin: 11.5 g/dL — ABNORMAL LOW (ref 12.0–15.0)
MCH: 30.7 pg (ref 26.0–34.0)
MCHC: 33.6 g/dL (ref 30.0–36.0)
MCV: 91.4 fL (ref 80.0–100.0)
Platelets: 331 10*3/uL (ref 150–400)
RBC: 3.74 MIL/uL — ABNORMAL LOW (ref 3.87–5.11)
RDW: 12.2 % (ref 11.5–15.5)
WBC: 7.4 10*3/uL (ref 4.0–10.5)
nRBC: 0 % (ref 0.0–0.2)

## 2020-03-31 LAB — POCT PREGNANCY, URINE: Preg Test, Ur: POSITIVE — AB

## 2020-03-31 LAB — URINALYSIS, ROUTINE W REFLEX MICROSCOPIC
Bacteria, UA: NONE SEEN
Bilirubin Urine: NEGATIVE
Glucose, UA: NEGATIVE mg/dL
Hgb urine dipstick: NEGATIVE
Ketones, ur: NEGATIVE mg/dL
Nitrite: NEGATIVE
Protein, ur: NEGATIVE mg/dL
Specific Gravity, Urine: 1.016 (ref 1.005–1.030)
pH: 7 (ref 5.0–8.0)

## 2020-03-31 LAB — WET PREP, GENITAL
Clue Cells Wet Prep HPF POC: NONE SEEN
Sperm: NONE SEEN
Trich, Wet Prep: NONE SEEN
Yeast Wet Prep HPF POC: NONE SEEN

## 2020-03-31 LAB — HCG, QUANTITATIVE, PREGNANCY: hCG, Beta Chain, Quant, S: 152674 m[IU]/mL — ABNORMAL HIGH (ref <5)

## 2020-03-31 MED ORDER — DOXYLAMINE-PYRIDOXINE 10-10 MG PO TBEC: 1.0000 | DELAYED_RELEASE_TABLET | Freq: Every day | ORAL | 2 refills | Status: DC

## 2020-03-31 NOTE — MAU Provider Note (Signed)
Chief Complaint: Nausea, Emesis, and Abdominal Pain   None    SUBJECTIVE HPI: Sheri Nunez is a 19 y.o. G3P1001 at 107w3d who presents to Maternity Admissions reporting nausea/vomiting and mild cramping; she had some spotting two days ago but none today  Past Medical History:  Diagnosis Date  . UTI (urinary tract infection)    OB History  Gravida Para Term Preterm AB Living  3 1 1     1   SAB TAB Ectopic Multiple Live Births        0 1    # Outcome Date GA Lbr Len/2nd Weight Sex Delivery Anes PTL Lv  3 Current           2 Term 09/10/18 [redacted]w[redacted]d 05:23 / 00:23 6 lb 11.1 oz (3.036 kg) F Vag-Spont EPI  LIV     Birth Comments: WNL  1 Gravida            Past Surgical History:  Procedure Laterality Date  . NO PAST SURGERIES     Social History   Socioeconomic History  . Marital status: Single    Spouse name: Not on file  . Number of children: Not on file  . Years of education: Not on file  . Highest education level: Not on file  Occupational History  . Not on file  Tobacco Use  . Smoking status: Never Smoker  . Smokeless tobacco: Never Used  Vaping Use  . Vaping Use: Never used  Substance and Sexual Activity  . Alcohol use: No  . Drug use: No  . Sexual activity: Not Currently    Birth control/protection: None  Other Topics Concern  . Not on file  Social History Narrative  . Not on file   Social Determinants of Health   Financial Resource Strain:   . Difficulty of Paying Living Expenses:   Food Insecurity:   . Worried About [redacted]w[redacted]d in the Last Year:   . Programme researcher, broadcasting/film/video in the Last Year:   Transportation Needs:   . Barista (Medical):   Freight forwarder Lack of Transportation (Non-Medical):   Physical Activity:   . Days of Exercise per Week:   . Minutes of Exercise per Session:   Stress:   . Feeling of Stress :   Social Connections:   . Frequency of Communication with Friends and Family:   . Frequency of Social Gatherings with Friends and Family:    . Attends Religious Services:   . Active Member of Clubs or Organizations:   . Attends [employer] Meetings:   Banker Marital Status:   Intimate Partner Violence:   . Fear of Current or Ex-Partner:   . Emotionally Abused:   [employer] Physically Abused:   . Sexually Abused:    Family History  Problem Relation Age of Onset  . Healthy Mother   . Healthy Father    No current facility-administered medications on file prior to encounter.   Current Outpatient Medications on File Prior to Encounter  Medication Sig Dispense Refill  . lansoprazole (PREVACID) 15 MG capsule Take 1 capsule (15 mg total) by mouth daily. (Patient not taking: Reported on 12/29/2019) 14 capsule 0   No Known Allergies  I have reviewed patient's Past Medical Hx, Surgical Hx, Family Hx, Social Hx, medications and allergies.   Review of Systems  Gastrointestinal: Positive for nausea and vomiting. Negative for constipation and diarrhea.  Genitourinary: Positive for pelvic pain (mild cramping) and vaginal bleeding (within the week,  but none today). Negative for vaginal discharge.  All other systems reviewed and are negative.   OBJECTIVE Patient Vitals for the past 24 hrs:  BP Temp Temp src Pulse Resp SpO2 Height Weight  03/31/20 1952 105/64 97.7 F (36.5 C) Oral 97 16 100 % -- --  03/31/20 1438 119/67 98.5 F (36.9 C) -- 92 16 100 % -- --  03/31/20 1434 -- -- -- -- -- -- 5\' 8"  (1.727 m) 159 lb (72.1 kg)   Constitutional: Well-developed, well-nourished female in no acute distress.  Cardiovascular: normal rate & rhythm, no murmur Respiratory: normal rate and effort. Lung sounds clear throughout GI: Abd soft, non-tender, Pos BS x 4. No guarding or rebound tenderness MS: Extremities nontender, no edema, normal ROM Neurologic: Alert and oriented x 4.  No bleeding or vaginal discharge today, speculum exam deferred, pt blind swabbed for specimen collection   LAB RESULTS Results for orders placed or performed during  the hospital encounter of 03/31/20 (from the past 24 hour(s))  Pregnancy, urine POC     Status: Abnormal   Collection Time: 03/31/20  2:41 PM  Result Value Ref Range   Preg Test, Ur POSITIVE (A) NEGATIVE  Urinalysis, Routine w reflex microscopic     Status: Abnormal   Collection Time: 03/31/20  2:43 PM  Result Value Ref Range   Color, Urine YELLOW YELLOW   APPearance CLEAR CLEAR   Specific Gravity, Urine 1.016 1.005 - 1.030   pH 7.0 5.0 - 8.0   Glucose, UA NEGATIVE NEGATIVE mg/dL   Hgb urine dipstick NEGATIVE NEGATIVE   Bilirubin Urine NEGATIVE NEGATIVE   Ketones, ur NEGATIVE NEGATIVE mg/dL   Protein, ur NEGATIVE NEGATIVE mg/dL   Nitrite NEGATIVE NEGATIVE   Leukocytes,Ua SMALL (A) NEGATIVE   RBC / HPF 0-5 0 - 5 RBC/hpf   WBC, UA 0-5 0 - 5 WBC/hpf   Bacteria, UA NONE SEEN NONE SEEN   Squamous Epithelial / LPF 11-20 0 - 5   Mucus PRESENT   CBC     Status: Abnormal   Collection Time: 03/31/20  3:26 PM  Result Value Ref Range   WBC 7.4 4.0 - 10.5 K/uL   RBC 3.74 (L) 3.87 - 5.11 MIL/uL   Hemoglobin 11.5 (L) 12.0 - 15.0 g/dL   HCT 04/02/20 (L) 36 - 46 %   MCV 91.4 80.0 - 100.0 fL   MCH 30.7 26.0 - 34.0 pg   MCHC 33.6 30.0 - 36.0 g/dL   RDW 89.3 81.0 - 17.5 %   Platelets 331 150 - 400 K/uL   nRBC 0.0 0.0 - 0.2 %  hCG, quantitative, pregnancy     Status: Abnormal   Collection Time: 03/31/20  3:26 PM  Result Value Ref Range   hCG, Beta Chain, Quant, S 152,674 (H) <5 mIU/mL  Wet prep, genital     Status: Abnormal   Collection Time: 03/31/20  6:43 PM   Specimen: Vaginal  Result Value Ref Range   Yeast Wet Prep HPF POC NONE SEEN NONE SEEN   Trich, Wet Prep NONE SEEN NONE SEEN   Clue Cells Wet Prep HPF POC NONE SEEN NONE SEEN   WBC, Wet Prep HPF POC MANY (A) NONE SEEN   Sperm NONE SEEN     IMAGING 04/02/20 OB LESS THAN 14 WEEKS WITH OB TRANSVAGINAL  Result Date: 03/31/2020 CLINICAL DATA:  Pain and pregnancy. EXAM: OBSTETRIC <14 WK 04/02/2020 AND TRANSVAGINAL OB US TECHNIQUE: Both  transabdominal and transvaginal ultrasound examinations were performed for  complete evaluation of the gestation as well as the maternal uterus, adnexal regions, and pelvic cul-de-sac. Transvaginal technique was performed to assess early pregnancy. COMPARISON:  None for this pregnancy. FINDINGS: Intrauterine gestational sac: Single Yolk sac:  Visualized. Embryo:  Visualized. Cardiac Activity: Visualized. Heart Rate: 157 bpm MSD:   mm    w     d CRL:  16.7 mm 8 w   0 d                  [place] EDC: 11/10/2020 Subchorionic hemorrhage:  Small subchorionic hemorrhage is present. Maternal uterus/adnexae: Normal. Corpus luteal cyst on the left ovary. IMPRESSION: Single live intrauterine pregnancy corresponding to 8 weeks and 0 days gestation. Small, 1.1 cm in greatest dimension, subchorionic hemorrhage present. Electronically Signed   By: Ted Mcalpine M.D.   On: 03/31/2020 16:31    MAU COURSE Orders Placed This Encounter  Procedures  . Wet prep, genital  . US OB LESS THAN 14 WEEKS WITH OB TRANSVAGINAL  . Urinalysis, Routine w reflex microscopic  . CBC  . hCG, quantitative, pregnancy  . Pregnancy, urine POC  . Discharge patient   Meds ordered this encounter  Medications  . Doxylamine-Pyridoxine (DICLEGIS) 10-10 MG TBEC    Sig: Take 1 tablet by mouth at bedtime.    Dispense:  60 tablet    Refill:  2    Order Specific Question:   Supervising Provider    Answer:   Reva Bores [[id number]]    MDM Wet prep normal GC/CT pending UA normal Ultrasound showed IUP with small subchorionic hemorrhage CBC normal Blood type O+  ASSESSMENT 1. Subchorionic hematoma in first trimester, single or unspecified fetus   2. Abdominal pain affecting pregnancy   3. [redacted] weeks gestation of pregnancy     PLAN Discharge home in stable condition. Bleeding precautions given Pt to establish routine OB care, list of OB providers given   Allergies as of 03/31/2020   No Known Allergies     Medication List    TAKE  these medications   Doxylamine-Pyridoxine 10-10 MG Tbec Commonly known as: Diclegis Take 1 tablet by mouth at bedtime.   lansoprazole 15 MG capsule Commonly known as: Prevacid Take 1 capsule (15 mg total) by mouth daily.        Bernerd Limbo, [place] 03/31/2020  7:59 PM

## 2020-03-31 NOTE — MAU Note (Signed)
Sheri Nunez is a 19 y.o. here in MAU reporting: had a + UPT a couple days ago. Has been having nausea and vomiting and abdominal pain. Had some spotting a few days ago but none today.  LMP: 02/08/2020 approximately  Onset of complaint: ongoing  Pain score: 4/10  Vitals:   03/31/20 1438  BP: 119/67  Pulse: 92  Resp: 16  Temp: 98.5 F (36.9 C)  SpO2: 100%     Lab orders placed from triage: UPT

## 2020-03-31 NOTE — Discharge Instructions (Signed)
Subchorionic Hematoma  A subchorionic hematoma is a gathering of blood between the outer wall of the embryo (chorion) and the inner wall of the womb (uterus). This condition can cause vaginal bleeding. If they cause little or no vaginal bleeding, early small hematomas usually shrink on their own and do not affect your baby or pregnancy. When bleeding starts later in pregnancy, or if the hematoma is larger or occurs in older pregnant women, the condition may be more serious. Larger hematomas may get bigger, which increases the chances of miscarriage. This condition also increases the risk of:  Premature separation of the placenta from the uterus.  Premature (preterm) labor.  Stillbirth. What are the causes? The exact cause of this condition is not known. It occurs when blood is trapped between the placenta and the uterine wall because the placenta has separated from the original site of implantation. What increases the risk? You are more likely to develop this condition if:  You were treated with fertility medicines.  You conceived through in vitro fertilization (IVF). What are the signs or symptoms? Symptoms of this condition include:  Vaginal spotting or bleeding.  Contractions of the uterus. These cause abdominal pain. Sometimes you may have no symptoms and the bleeding may only be seen when ultrasound images are taken (transvaginal ultrasound). How is this diagnosed? This condition is diagnosed based on a physical exam. This includes a pelvic exam. You may also have other tests, including:  Blood tests.  Urine tests.  Ultrasound of the abdomen. How is this treated? Treatment for this condition can vary. Treatment may include:  Watchful waiting. You will be monitored closely for any changes in bleeding. During this stage: ? The hematoma may be reabsorbed by the body. ? The hematoma may separate the fluid-filled space containing the embryo (gestational sac) from the wall of the  womb (endometrium).  Medicines.  Activity restriction. This may be needed until the bleeding stops. Follow these instructions at home:  Stay on bed rest if told to do so by your health care provider.  Do not lift anything that is heavier than 10 lbs. (4.5 kg) or as told by your health care provider.  Do not use any products that contain nicotine or tobacco, such as cigarettes and e-cigarettes. If you need help quitting, ask your health care provider.  Track and write down the number of pads you use each day and how soaked (saturated) they are.  Do not use tampons.  Keep all follow-up visits as told by your health care provider. This is important. Your health care provider may ask you to have follow-up blood tests or ultrasound tests or both. Contact a health care provider if:  You have any vaginal bleeding.  You have a fever. Get help right away if:  You have severe cramps in your stomach, back, abdomen, or pelvis.  You pass large clots or tissue. Save any tissue for your health care provider to look at.  You have more vaginal bleeding, and you faint or become lightheaded or weak. Summary  A subchorionic hematoma is a gathering of blood between the outer wall of the placenta and the uterus.  This condition can cause vaginal bleeding.  Sometimes you may have no symptoms and the bleeding may only be seen when ultrasound images are taken.  Treatment may include watchful waiting, medicines, or activity restriction. This information is not intended to replace advice given to you by your health care provider. Make sure you discuss any questions you   have with your health care provider. Document Revised: 08/13/2017 Document Reviewed: 10/27/2016 Elsevier Patient Education  2020 Elsevier Inc.  [place] [facility] Providers    [facility] [employer] [facility]       Phone: [phone]  [facility] [employer] at [place]   Phone: [phone]  [facility] [employer] at [place]  Phone: [phone]  [facility]  Phone: [phone]  [facility] [employer] [facility] at [place]  Phone: [phone]  [facility] at [facility]   Phone: [phone]  [facility]       Phone: [phone]  [facility]    Phone: [phone]   [facility]    Phone: [phone]  [facility]    Phone: [phone]  [facility]    Phone: [phone]  [facility]       Phone: [phone]   [facility]  Phone: [phone]  [facility]    Phone: [phone]  [facility]   Phone: [phone]  [facility]      Phone: [phone]  [facility]    Phone: [phone]   First Trimester of Pregnancy  The first trimester of pregnancy is from week 1 until the end of week 13 (months 1 through 3). During this time, your baby will begin to develop inside you. At 6-8 weeks, the eyes and face are formed, and the heartbeat can be seen on ultrasound. At the end of 12 weeks, all the baby's organs are formed. Prenatal care is all the medical care you receive before the birth of your baby. Make sure you get good prenatal care and follow all of your doctor's instructions. Follow these instructions at home: Medicines  Take over-the-counter and prescription medicines only as told by your doctor. Some medicines are safe and some medicines are not safe during pregnancy.  Take a prenatal vitamin that contains at least 600 micrograms (mcg) of folic acid.  If you have trouble pooping (constipation), take medicine that will make your stool soft (stool softener) if your doctor approves. Eating and drinking   Eat regular, healthy meals.  Your doctor will tell you the  amount of weight gain that is right for you.  Avoid raw meat and uncooked cheese.  If you feel sick to your stomach (nauseous) or throw up (vomit): ? Eat 4 or 5 small meals a day instead of 3 large meals. ? Try eating a few soda crackers. ? Drink liquids between meals instead of during meals.  To prevent constipation: ? Eat foods that are high in fiber, like fresh fruits and vegetables, whole grains, and beans. ? Drink enough fluids to keep your pee (urine) clear or pale yellow. Activity  Exercise only as told by your doctor. Stop exercising if you have cramps or pain in your lower belly (abdomen) or low back.  Do not exercise if it is too hot, too humid, or if you are in a place of great height (high altitude).  Try to avoid standing for long periods of time. Move your legs often if you must stand in one place for a long time.  Avoid heavy lifting.  Wear low-heeled shoes. Sit and stand up straight.  You can have sex unless your doctor tells you not to. Relieving pain and discomfort  Wear a good support bra if your breasts are sore.  Take warm water baths (sitz baths) to soothe pain or discomfort caused by hemorrhoids. Use hemorrhoid cream if your doctor says it is okay.  Rest with your legs raised if you have leg cramps or low back pain.  If you have puffy, bulging veins (varicose veins) in your legs: ? Wear support hose or compression stockings as told by your doctor. ? Raise (elevate) your feet for 15 minutes, 3-4 times a day. ? Limit salt in your food. Prenatal care  Schedule your prenatal visits by the twelfth week of pregnancy.  Write down your questions. Take them to your prenatal visits.  Keep all your prenatal visits as told by your doctor. This is important. Safety  Wear your seat belt at all times when driving.  Make a list of emergency phone numbers. The list should include numbers for family, friends, the hospital, and police and fire departments. General  instructions  Ask your doctor for a referral to a local prenatal class. Begin classes no later than at the start of month 6 of your pregnancy.  Ask for help if you need counseling or if you need help with nutrition. Your doctor can give you advice or tell you where to go for help.  Do not use hot tubs, steam rooms, or saunas.  Do not douche or use tampons or scented sanitary pads.  Do not cross your legs for long periods of time.  Avoid all herbs and alcohol. Avoid drugs that are not approved by your doctor.  Do not use any tobacco products, including cigarettes, chewing tobacco, and electronic cigarettes. If you need help quitting, ask your doctor. You may get counseling or other support to help you quit.  Avoid cat litter boxes and soil used by cats. These carry germs that can cause birth defects in the baby and can cause a loss of your baby (miscarriage) or stillbirth.  Visit your dentist. At home, brush your teeth with a soft toothbrush. Be gentle when you floss. Contact a doctor if:  You are dizzy.  You have mild cramps or pressure in your lower belly.  You have a nagging pain in your belly area.  You continue to feel sick to your stomach, you throw up, or you have watery poop (diarrhea).  You have a bad smelling fluid coming from your vagina.  You have pain when you pee (urinate).  You have increased puffiness (swelling) in your face, hands, legs, or ankles. Get help right away if:  You have a fever.  You are leaking fluid from your vagina.  You have spotting or bleeding from your vagina.  You have very bad belly cramping or pain.  You gain or lose weight rapidly.  You throw up blood. It may look like coffee grounds.  You are around people who have [place] measles, fifth disease, or chickenpox.  You have a very bad headache.  You have shortness of breath.  You have any kind of trauma, such as from a fall or a car accident. Summary  The first trimester of  pregnancy is from week 1 until the end of week 13 (months 1 through 3).  To take care of yourself and your unborn baby, you will need to eat healthy meals, take medicines only if your doctor tells you to do so, and do activities that are safe for you and your baby.  Keep all follow-up visits as told by your doctor. This is important as your doctor will have to ensure that your baby  is healthy and growing well. This information is not intended to replace advice given to you by your health care provider. Make sure you discuss any questions you have with your health care provider. Document Revised: 12/22/2018 Document Reviewed: 09/08/2016 Elsevier Patient Education  2020 [employer].

## 2020-03-31 NOTE — MAU Note (Signed)
Pt called, not in lobby. Registration informs this RN that patient went to the cafeteria.

## 2020-04-01 LAB — GC/CHLAMYDIA PROBE AMP (~~LOC~~) NOT AT ARMC
Chlamydia: NEGATIVE
Comment: NEGATIVE
Comment: NORMAL
Neisseria Gonorrhea: NEGATIVE

## 2020-04-02 ENCOUNTER — Encounter: Payer: Self-pay | Admitting: Family Medicine

## 2020-04-02 ENCOUNTER — Ambulatory Visit (INDEPENDENT_AMBULATORY_CARE_PROVIDER_SITE_OTHER): Payer: Medicaid Other | Admitting: Family Medicine

## 2020-04-02 ENCOUNTER — Other Ambulatory Visit: Payer: Self-pay

## 2020-04-02 VITALS — BP 110/64 | HR 70 | Wt 155.0 lb

## 2020-04-02 DIAGNOSIS — N898 Other specified noninflammatory disorders of vagina: Secondary | ICD-10-CM

## 2020-04-02 DIAGNOSIS — Z3481 Encounter for supervision of other normal pregnancy, first trimester: Secondary | ICD-10-CM | POA: Diagnosis not present

## 2020-04-02 DIAGNOSIS — Z3490 Encounter for supervision of normal pregnancy, unspecified, unspecified trimester: Secondary | ICD-10-CM | POA: Insufficient documentation

## 2020-04-02 LAB — POCT WET PREP (WET MOUNT)
Clue Cells Wet Prep Whiff POC: NEGATIVE
Trichomonas Wet Prep HPF POC: ABSENT

## 2020-04-02 NOTE — Assessment & Plan Note (Signed)
-  She is encouraged to discuss pregnancy with FOB to determine if she is going to keep the pregnancy. -We will return for future lab visit for initial prenatal labs -She was encouraged to set up her first prenatal visit in the next 2-4 weeks -Dating ultrasound already performed -She was encouraged to call back if Diclegis was expensive or ineffective

## 2020-04-02 NOTE — Assessment & Plan Note (Signed)
Recent testing done on 7/18 was entirely normal.  Repeat wet prep today also showed no evidence of yeast, BV, trichomonas.  Likely normal leukorrhea associated with pregnancy.  No further management at this time.

## 2020-04-02 NOTE — Progress Notes (Signed)
    SUBJECTIVE:   CHIEF COMPLAINT / HPI:   Vaginal discharge Sheri Nunez was recently seen on 7/18 at the MAU at which time she had labs done which showed no yeast infection, BV, trichomonas, gonorrhea, chlamydia.  She was positive for pregnancy at that time.  She reports that she was not sure what the results of her labs were from that visit and she came in today because she is concerned that she continues to have some mild, thin, nonmalodorous vaginal discharge.  She realizes it may simply be due to pregnancy but wanted to make sure it is not an infection.  She denies vaginal itching.  New pregnancy She was found to have a positive pregnancy test on 7/18 at the MAU.  She is not sure if she wants to keep the pregnancy or not.  She has a baby at the moment who is less than 55-year-old.  She is in a relationship with the father of the baby and does have good family support in [place].  She has not yet had a chance to discuss the pregnancy with the FOB.  She reports that her last Last period twards the end of May she is not sure of the exact date.  She denies smoking and drinking.  She is not currently taking any medications at all.  She reports that she has been having significant morning sickness and was recently prescribed Diclegis but has not yet been able to pick it up.   PERTINENT  PMH / PSH: Short interval pregnancy  OBJECTIVE:   BP 110/64   Pulse 70   Wt 155 lb (70.3 kg)   LMP 02/08/2020 (Approximate)   BMI 23.57 kg/m    General: Alert and cooperative and appears to be in no acute distress Respiratory: Breathing comfortably on room air.  No respiratory distress. Extremities: Warm, dry Skin: No apparent rashes Pelvic: Pink, moist vaginal mucosa.  Mild, yellowish discharge noted.  Samples taken.  No significant discomfort with pelvic exam.   ASSESSMENT/PLAN:   Vaginal discharge Recent testing done on 7/18 was entirely normal.  Repeat wet prep today also showed no evidence of  yeast, BV, trichomonas.  Likely normal leukorrhea associated with pregnancy.  No further management at this time.  Supervision of normal pregnancy -She is encouraged to discuss pregnancy with FOB to determine if she is going to keep the pregnancy. -We will return for future lab visit for initial prenatal labs -She was encouraged to set up her first prenatal visit in the next 2-4 weeks -Dating ultrasound already performed -She was encouraged to call back if Diclegis was expensive or ineffective     Mirian Mo, MD [facility]

## 2020-05-17 ENCOUNTER — Ambulatory Visit: Payer: Medicaid Other | Admitting: Family Medicine

## 2020-06-06 ENCOUNTER — Other Ambulatory Visit (HOSPITAL_COMMUNITY)
Admission: RE | Admit: 2020-06-06 | Discharge: 2020-06-06 | Disposition: A | Discharge status: Home / Self Care | Payer: Medicaid Other | Source: Ambulatory Visit | Attending: Family Medicine | Admitting: Family Medicine

## 2020-06-06 ENCOUNTER — Other Ambulatory Visit: Payer: Self-pay

## 2020-06-06 ENCOUNTER — Encounter: Payer: Self-pay | Admitting: Family Medicine

## 2020-06-06 ENCOUNTER — Ambulatory Visit (INDEPENDENT_AMBULATORY_CARE_PROVIDER_SITE_OTHER): Payer: Medicaid Other | Admitting: Family Medicine

## 2020-06-06 VITALS — BP 104/62 | HR 87 | Ht 68.0 in | Wt 160.5 lb

## 2020-06-06 DIAGNOSIS — Z7689 Persons encountering health services in other specified circumstances: Secondary | ICD-10-CM

## 2020-06-06 DIAGNOSIS — Z7251 High risk heterosexual behavior: Secondary | ICD-10-CM | POA: Diagnosis not present

## 2020-06-06 DIAGNOSIS — Z30016 Encounter for initial prescription of transdermal patch hormonal contraceptive device: Secondary | ICD-10-CM | POA: Diagnosis not present

## 2020-06-06 LAB — POCT WET PREP (WET MOUNT)
Clue Cells Wet Prep Whiff POC: NEGATIVE
Trichomonas Wet Prep HPF POC: ABSENT

## 2020-06-06 MED ORDER — ESTRADIOL 0.025 MG/24HR TD PTWK: 0.0250 mg | MEDICATED_PATCH | TRANSDERMAL | 12 refills | Status: DC

## 2020-06-06 NOTE — Progress Notes (Signed)
    SUBJECTIVE:   CHIEF COMPLAINT / HPI:   Chief Complaint  Patient presents with  . Vaginal Discharge    Sheri Nunez is a 19 y.o. female presents for recurrent discharge. States it is like a yeast infection. She was treated in with a pill in July 2021 and feels like her sx never went away.    Vaginal Discharge Having malodourous "fishy" vaginal discharge for 2 weeks ago.  Discharge consistency: thick  Discharge color: white Medications tried: took Fluconazole 2 months ago No recent antibiotic use Endorses: itching,   - Denies vaginal burning, abdominal pain, dysuria or hematuria, nausea or vomiting, fevers or pelvic pain.  - Patient reports no STIs in the past.  - Sexully active with one female partner.  -  Last normal LMP 05/27/20  - Patient reports  BV, yeast in the past.  - Patient denies douching.  -Contraception: nothing.  Possible STD exposure: no but would like to get checked for them all   Symptoms Fever: no Dysuria:no Vaginal bleeding: no Abdomen or Pelvic pain: no Back pain: no Genital sores or ulcers:no Rash: no Pain during sex: no Odor : yes      PERTINENT  PMH / PSH: nreviewed and updated as appropriate   OBJECTIVE:   BP 104/62   Pulse 87   Ht 5\' 8"  (1.727 m)   Wt 160 lb 8 oz (72.8 kg)   LMP 01/25/2020   SpO2 98%   Breastfeeding Unknown   BMI 24.40 kg/m   GEN: well appearing female in no acute distress  CVS: well perfused  RESP: speaking in full sentences without pause  ABD: soft, non-tender, non-distended, no palpable masses  Pelvic exam: normal external genitalia, vulva, VAGINA and CERVIX: cervical discharge present - mucoid, ADNEXA: normal adnexa in size, nontender and no masses, WET MOUNT done - results: excessive bacteria, exam chaperoned by CMA.     ASSESSMENT/PLAN:    High risk heterosexual behavior GC and chlamydia DNA  probe sent to lab. HIV and RPR collected. Negative Trichomoniasis, BV and yeast on wet prep.  - Abstain  from coitus during course of treatment.  - F/U if symptoms not improving or getting worse.  - Will f/u on GC, CT, HIV and syphilis,  call in Rx if positive.  - Self care instructions given including avoiding douching. Handout given.  - F/U with PCP as needed.  - Return precautions including abdominal pain, fever, chills, nausea, or vomiting given.   Contraception Counseling  - Education given regarding options for contraception, including barrier methods, injectable contraception, IUD placement, oral contraceptives and patch. - Patient's last menstrual period was 05/27/2020.  - Patient would like to start patch. Will obtain urine pregnancy test and start the patch.     05/29/2020, DO [place] [facility]

## 2020-06-07 LAB — RPR: RPR Ser Ql: NONREACTIVE

## 2020-06-07 LAB — HIV ANTIBODY (ROUTINE TESTING W REFLEX): HIV Screen 4th Generation wRfx: NONREACTIVE

## 2020-06-07 LAB — CERVICOVAGINAL ANCILLARY ONLY
Chlamydia: NEGATIVE
Comment: NEGATIVE
Comment: NORMAL
Neisseria Gonorrhea: NEGATIVE

## 2020-06-30 ENCOUNTER — Encounter (HOSPITAL_COMMUNITY): Payer: Self-pay | Admitting: Emergency Medicine

## 2020-06-30 ENCOUNTER — Ambulatory Visit (HOSPITAL_COMMUNITY)
Admission: EM | Admit: 2020-06-30 | Discharge: 2020-06-30 | Disposition: A | Discharge status: Home / Self Care | Payer: Medicaid Other | Attending: Family Medicine | Admitting: Family Medicine

## 2020-06-30 ENCOUNTER — Other Ambulatory Visit: Payer: Self-pay

## 2020-06-30 DIAGNOSIS — N898 Other specified noninflammatory disorders of vagina: Secondary | ICD-10-CM | POA: Diagnosis not present

## 2020-06-30 DIAGNOSIS — N760 Acute vaginitis: Secondary | ICD-10-CM | POA: Diagnosis not present

## 2020-06-30 MED ORDER — METRONIDAZOLE 500 MG PO TABS: 500.0000 mg | ORAL_TABLET | Freq: Two times a day (BID) | ORAL | 0 refills | Status: DC

## 2020-06-30 MED ORDER — FLUCONAZOLE 150 MG PO TABS: 150.0000 mg | ORAL_TABLET | Freq: Every day | ORAL | 0 refills | Status: DC

## 2020-06-30 NOTE — Discharge Instructions (Addendum)
Your swab testing will be back in about 2 days.  Do not have sex until tests are back and negative or until treatment is completed, whichever is longer  I have sent in metronidazole for you to take twice a day for 7 days  I have sent in fluconazole for you to take in case of yeast. Take 2 tablets at the onset of symptoms, and if symptoms are still present 3 days later, take the second tablet  We will follow up with you regarding abnormal tests that will require further treatment  You may also begin to use Boric Acid vaginal suppositories. Use one suppository daily for 30 days. Then you may use one twice a week as needed for maintenance.  Follow up with this office or with primary care if symptoms are persisting.  Follow up in the ER for high fever, trouble swallowing, trouble breathing, other concerning symptoms.

## 2020-06-30 NOTE — ED Provider Notes (Signed)
[facility]   [id number] 06/30/20 Arrival Time: 1200   CC: VAGINAL DISCHARGE  SUBJECTIVE:  Sheri Nunez is a 19 y.o. female who presents with complaints of gradual vaginal discharge and irritation for the last few weeks. Reports that she was seen with internal medicine and that she was told that everything was normal. Reports significant increase in vaginal discharge and fishy smell since last night. Has hx of BV and yeast. Has not attempted OTC treatment for this. There are not aggravating or alleviating factors. She denies fever, chills, nausea, vomiting, abdominal or pelvic pain, urinary symptoms, vaginal itching, vaginal bleeding, dyspareunia, vaginal rashes or lesions.   Patient's last menstrual period was 06/21/2020. Current birth control method: Compliant with BC:  ROS: As per HPI.  All other pertinent ROS negative.     Past Medical History:  Diagnosis Date  . UTI (urinary tract infection)    Past Surgical History:  Procedure Laterality Date  . NO PAST SURGERIES     No Known Allergies No current facility-administered medications on file prior to encounter.   No current outpatient medications on file prior to encounter.    Social History   Socioeconomic History  . Marital status: Single    Spouse name: Not on file  . Number of children: Not on file  . Years of education: Not on file  . Highest education level: Not on file  Occupational History  . Not on file  Tobacco Use  . Smoking status: Never Smoker  . Smokeless tobacco: Never Used  Vaping Use  . Vaping Use: Never used  Substance and Sexual Activity  . Alcohol use: Yes  . Drug use: No  . Sexual activity: Not Currently    Birth control/protection: None  Other Topics Concern  . Not on file  Social History Narrative  . Not on file   Social Determinants of Health   Financial Resource Strain:   . Difficulty of Paying Living Expenses: Not on file  Food Insecurity:   . Worried About  Programme researcher, broadcasting/film/video in the Last Year: Not on file  . Ran Out of Food in the Last Year: Not on file  Transportation Needs:   . Lack of Transportation (Medical): Not on file  . Lack of Transportation (Non-Medical): Not on file  Physical Activity:   . Days of Exercise per Week: Not on file  . Minutes of Exercise per Session: Not on file  Stress:   . Feeling of Stress : Not on file  Social Connections:   . Frequency of Communication with Friends and Family: Not on file  . Frequency of Social Gatherings with Friends and Family: Not on file  . Attends Religious Services: Not on file  . Active Member of Clubs or Organizations: Not on file  . Attends Banker Meetings: Not on file  . Marital Status: Not on file  Intimate Partner Violence:   . Fear of Current or Ex-Partner: Not on file  . Emotionally Abused: Not on file  . Physically Abused: Not on file  . Sexually Abused: Not on file   Family History  Problem Relation Age of Onset  . Healthy Mother   . Healthy Father     OBJECTIVE:  Vitals:   06/30/20 1441  BP: 123/73  Pulse: 92  Resp: 18  Temp: 98.8 F (37.1 C)  TempSrc: Oral  SpO2: 100%     General appearance: Alert, NAD, appears stated age Head: NCAT Throat: lips, mucosa, and  tongue normal; teeth and gums normal Lungs: CTA bilaterally without adventitious breath sounds Heart: regular rate and rhythm.  Radial pulses 2+ symmetrical bilaterally Back: no CVA tenderness Abdomen: soft, non-tender; bowel sounds normal; no masses or organomegaly; no guarding or rebound tenderness GU: External examination without vulvar lesions or erythema Bimanual exam: Negative for cervical motion or adenexal tenderness; Speculum exam: Thick whitish yellow discharge during pelvic exam. Cervix visualized without erythema or lesions. Cervical swab obtained Skin: warm and dry Psychological:  Alert and cooperative. Normal mood and affect.  LABS:  Results for orders placed or  performed in visit on 06/06/20  HIV antibody (with reflex)  Result Value Ref Range   HIV Screen 4th Generation wRfx Non Reactive Non Reactive  RPR  Result Value Ref Range   RPR Ser Ql Non Reactive Non Reactive  POCT Wet Prep (Wet Mount)  Result Value Ref Range   Source Wet Prep POC VAG    WBC, Wet Prep HPF POC 1-5    Bacteria Wet Prep HPF POC Moderate (A) Few   Clue Cells Wet Prep HPF POC None None   Clue Cells Wet Prep Whiff POC Negative Whiff    Yeast Wet Prep HPF POC None None   KOH Wet Prep POC None None   Trichomonas Wet Prep HPF POC Absent Absent  Cervicovaginal ancillary only  Result Value Ref Range   Neisseria Gonorrhea Negative    Chlamydia Negative    Comment Normal Reference Ranger Chlamydia - Negative    Comment      Normal Reference Range Neisseria Gonorrhea - Negative    Labs Reviewed  CERVICOVAGINAL ANCILLARY ONLY    ASSESSMENT & PLAN:  1. Acute vaginitis   2. Vaginal discharge     Meds ordered this encounter  Medications  . metroNIDAZOLE (FLAGYL) 500 MG tablet    Sig: Take 1 tablet (500 mg total) by mouth 2 (two) times daily.    Dispense:  14 tablet    Refill:  0    Order Specific Question:   Supervising Provider    Answer:   Merrilee Jansky [id number]  . fluconazole (DIFLUCAN) 150 MG tablet    Sig: Take 1 tablet (150 mg total) by mouth daily.    Dispense:  2 tablet    Refill:  0    Order Specific Question:   Supervising Provider    Answer:   Merrilee Jansky [[id number]]    Pending: Labs Reviewed  CERVICOVAGINAL ANCILLARY ONLY   Declines HIV/ syphilis testing today Prescribed metronidazole 500 mg twice daily for 7 days (do not take while consuming alcohol and/or if breastfeeding) Prescribed diflucan 150 mg once daily and then second dose 72 hours later Take medications as prescribed and to completion If tests results are positive, please abstain from sexual activity until you and your partner(s) have been treated Follow up with PCP or  [facility] if symptoms persists Return here or go to ER if you have any new or worsening symptoms fever, chills, nausea, vomiting, abdominal or pelvic pain, painful intercourse, vaginal discharge, vaginal bleeding, persistent symptoms despite treatment Reviewed expectations re: course of current medical issues. Questions answered. Outlined signs and symptoms indicating need for more acute intervention. Patient verbalized understanding. After Visit Summary given.       Moshe Cipro, NP 06/30/20 1513

## 2020-06-30 NOTE — ED Triage Notes (Signed)
Reports pcp did sti testing and reported as negative and no treatment.. patient is convinced something is wrong, vaginal discharge swelling and irritation

## 2020-07-01 LAB — CERVICOVAGINAL ANCILLARY ONLY
Bacterial Vaginitis (gardnerella): POSITIVE — AB
Candida Glabrata: NEGATIVE
Candida Vaginitis: POSITIVE — AB
Chlamydia: NEGATIVE
Comment: NEGATIVE
Comment: NEGATIVE
Comment: NEGATIVE
Comment: NEGATIVE
Comment: NEGATIVE
Comment: NORMAL
Neisseria Gonorrhea: NEGATIVE
Trichomonas: NEGATIVE

## 2020-07-02 ENCOUNTER — Ambulatory Visit: Payer: Medicaid Other | Admitting: Family Medicine

## 2020-09-19 ENCOUNTER — Ambulatory Visit (INDEPENDENT_AMBULATORY_CARE_PROVIDER_SITE_OTHER): Payer: Medicaid Other | Admitting: Family Medicine

## 2020-09-19 ENCOUNTER — Other Ambulatory Visit (HOSPITAL_COMMUNITY)
Admission: RE | Admit: 2020-09-19 | Discharge: 2020-09-19 | Disposition: A | Discharge status: Home / Self Care | Payer: Medicaid Other | Source: Ambulatory Visit | Attending: Family Medicine | Admitting: Family Medicine

## 2020-09-19 ENCOUNTER — Other Ambulatory Visit: Payer: Self-pay

## 2020-09-19 VITALS — Wt 162.0 lb

## 2020-09-19 DIAGNOSIS — N898 Other specified noninflammatory disorders of vagina: Secondary | ICD-10-CM

## 2020-09-19 DIAGNOSIS — Z124 Encounter for screening for malignant neoplasm of cervix: Secondary | ICD-10-CM | POA: Insufficient documentation

## 2020-09-19 DIAGNOSIS — Z3A09 9 weeks gestation of pregnancy: Secondary | ICD-10-CM | POA: Diagnosis not present

## 2020-09-19 DIAGNOSIS — Z23 Encounter for immunization: Secondary | ICD-10-CM

## 2020-09-19 DIAGNOSIS — N912 Amenorrhea, unspecified: Secondary | ICD-10-CM

## 2020-09-19 LAB — POCT URINE PREGNANCY: Preg Test, Ur: POSITIVE — AB

## 2020-09-19 MED ORDER — PRENATAL VITAMINS 28-0.8 MG PO TABS: ORAL_TABLET | ORAL | 3 refills | Status: DC

## 2020-09-19 NOTE — Progress Notes (Signed)
Patient declined lab work today due to her clothing. I have changed orders for future.

## 2020-09-19 NOTE — Progress Notes (Signed)
    SUBJECTIVE:   CHIEF COMPLAINT / HPI: vaginal discharge  Vaginal Discharge Patient reports that vaginal discharge started last wee.  She notes that discharge appears thick and white.  She denies vaginal odors.  She denies vaginal pruritis, abnormal vaginal bleeding, dysuria, hematuria, frequency, abdominal pain pelvic pain, nausea, vomiting, fevers or new rash.   She has used not used anything OTC for  relief.   Has had similar symptoms in 10/20 and treated with Fluconazole.  She issexually active and does not use condoms.  Contraception: none.  LMP 11/04 estimarted.  She does not douche.  PERTINENT  PMH / PSH:  None  OBJECTIVE:   Wt 162 lb (73.5 kg)   LMP 07/19/2020   BMI 24.63 kg/m    General: Alert, no acute distress Pelvic Exam chaperoned by CMA  Ruthy Dick         External: normal female genitalia without lesions or masses        Vagina: normal without lesions or masses        Cervix: normal without lesions or masses            ASSESSMENT/PLAN:   Cervical cancer screening UPT positive OB labs ordered Pap smear: performed Samples for Wet prep, GC/Chlamydia obtained   Vaginal discharge UPT positive Samples for Wet prep, GC/Chlamydia obtained Will call patient with results Initial OB visit scheduled     Dana Allan, MD [facility]

## 2020-09-19 NOTE — Patient Instructions (Signed)
Thank you for coming to see me today. It was a pleasure.    Your pregnancy test was positive. Someone from the clinic will call you with your initial OB appointment.  I have ordered you prenatal vitamins.  Please start taking them today.  We will get some labs today.  If they are abnormal or we need to do something about them, I will call you.  If they are normal, I will send you a message on MyChart (if it is active) or a letter in the mail.  If you don't hear from [place] in 2 weeks, please call the office at the number below.   Please follow-up with for your OB visit when schedulued  If you have any questions or concerns, please do not hesitate to call the office at [phone].  Best,   Dana Allan, MD

## 2020-09-22 ENCOUNTER — Encounter: Payer: Self-pay | Admitting: Family Medicine

## 2020-09-22 DIAGNOSIS — Z124 Encounter for screening for malignant neoplasm of cervix: Secondary | ICD-10-CM | POA: Insufficient documentation

## 2020-09-22 NOTE — Assessment & Plan Note (Addendum)
UPT positive OB labs ordered Pap smear: performed Samples for Wet prep, GC/Chlamydia obtained

## 2020-09-22 NOTE — Assessment & Plan Note (Signed)
UPT positive Samples for Wet prep, GC/Chlamydia obtained Will call patient with results Initial OB visit scheduled

## 2020-09-23 ENCOUNTER — Other Ambulatory Visit: Payer: Self-pay | Admitting: Family Medicine

## 2020-09-23 ENCOUNTER — Encounter: Payer: Self-pay | Admitting: Family Medicine

## 2020-09-23 LAB — CERVICOVAGINAL ANCILLARY ONLY
Bacterial Vaginitis (gardnerella): NEGATIVE
Candida Glabrata: NEGATIVE
Candida Vaginitis: POSITIVE — AB
Chlamydia: NEGATIVE
Comment: NEGATIVE
Comment: NEGATIVE
Comment: NEGATIVE
Comment: NEGATIVE
Comment: NEGATIVE
Comment: NEGATIVE
Comment: NORMAL
Neisseria Gonorrhea: NEGATIVE
Trichomonas: NEGATIVE
Trichomonas: NEGATIVE

## 2020-09-23 MED ORDER — CLOTRIMAZOLE 1 % VA CREA: 1.0000 | TOPICAL_CREAM | Freq: Every day | VAGINAL | 0 refills | Status: AC

## 2020-09-24 ENCOUNTER — Encounter: Payer: Self-pay | Admitting: Family Medicine

## 2020-09-24 LAB — CYTOLOGY - PAP: Diagnosis: NEGATIVE

## 2020-09-24 NOTE — Progress Notes (Signed)
Letter sent.

## 2020-09-26 ENCOUNTER — Encounter: Payer: Medicaid Other | Admitting: Family Medicine

## 2020-12-11 ENCOUNTER — Other Ambulatory Visit: Payer: Self-pay

## 2020-12-11 ENCOUNTER — Encounter (HOSPITAL_COMMUNITY): Payer: Self-pay

## 2020-12-11 ENCOUNTER — Ambulatory Visit (HOSPITAL_COMMUNITY)
Admission: EM | Admit: 2020-12-11 | Discharge: 2020-12-11 | Disposition: A | Discharge status: Home / Self Care | Payer: Medicaid Other | Attending: Family Medicine | Admitting: Family Medicine

## 2020-12-11 DIAGNOSIS — Z113 Encounter for screening for infections with a predominantly sexual mode of transmission: Secondary | ICD-10-CM | POA: Diagnosis not present

## 2020-12-11 DIAGNOSIS — N898 Other specified noninflammatory disorders of vagina: Secondary | ICD-10-CM | POA: Diagnosis present

## 2020-12-11 DIAGNOSIS — N760 Acute vaginitis: Secondary | ICD-10-CM | POA: Insufficient documentation

## 2020-12-11 MED ORDER — FLUCONAZOLE 150 MG PO TABS
150.0000 mg | ORAL_TABLET | ORAL | 0 refills | Status: DC
Start: 2020-12-11 — End: 2021-08-14

## 2020-12-11 NOTE — Discharge Instructions (Signed)
May try boric acid suppositories over-the-counter as needed

## 2020-12-11 NOTE — ED Triage Notes (Signed)
Pt c/o vaginal itching  X 2 days. Pt states she has irritation in her vaginal area.

## 2020-12-12 LAB — CERVICOVAGINAL ANCILLARY ONLY
Bacterial Vaginitis (gardnerella): POSITIVE — AB
Candida Glabrata: NEGATIVE
Candida Vaginitis: POSITIVE — AB
Chlamydia: NEGATIVE
Comment: NEGATIVE
Comment: NEGATIVE
Comment: NEGATIVE
Comment: NEGATIVE
Comment: NEGATIVE
Comment: NORMAL
Neisseria Gonorrhea: NEGATIVE
Trichomonas: NEGATIVE

## 2020-12-12 NOTE — ED Provider Notes (Signed)
[facility]    CSN: [id number] Arrival date & time: 12/11/20  1821      History   Chief Complaint Chief Complaint  Patient presents with  . Vaginal Itching  . SEXUALLY TRANSMITTED DISEASE    HPI Sheri Nunez is a 20 y.o. female.   Patient presenting today with 2-day history of vaginal itching and irritation.  She denies dysuria, hematuria, rashes or lesions, abdominal pain or pelvic pain, nausea vomiting diarrhea.  Has not been trying anything over-the-counter for symptoms.  Would like to be tested for STDs today.  LMP 11/12/2020     Past Medical History:  Diagnosis Date  . UTI (urinary tract infection)     Patient Active Problem List   Diagnosis Date Noted  . Cervical cancer screening 09/22/2020  . Supervision of normal pregnancy 04/02/2020  . Encounter for assessment of STD exposure 12/29/2019  . Suspected 2019 novel coronavirus infection 06/12/2019  . Indication for care in labor or delivery 09/10/2018  . Contraception management 03/31/2016  . Vaginal discharge 04/27/2014  . 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 NO PAST SURGERIES      OB History    Gravida  3   Para  1   Term  1   Preterm      AB      Living  1     SAB      IAB      Ectopic      Multiple  0   Live Births  1            Home Medications    Prior to Admission medications   Medication Sig Start Date End Date Taking? Authorizing Provider  fluconazole (DIFLUCAN) 150 MG tablet Take 1 tablet (150 mg total) by mouth every other day. 12/11/20   Particia Nearing, PA-C  metroNIDAZOLE (FLAGYL) 500 MG tablet Take 1 tablet (500 mg total) by mouth 2 (two) times daily. 06/30/20   Moshe Cipro, NP  Prenatal Vit-Fe Fumarate-FA (PRENATAL VITAMINS) 28-0.8 MG TABS Take one tablet daily 09/19/20   Dana Allan, MD    Family History Family History  Problem Relation Age of Onset  . Healthy Mother   . Healthy Father      Social History Social History   Tobacco Use  . Smoking status: Never Smoker  . Smokeless tobacco: Never Used  Vaping Use  . Vaping Use: Never used  Substance Use Topics  . Alcohol use: Yes  . Drug use: No     Allergies   Patient has no known allergies.   Review of Systems Review of Systems Per HPI Physical Exam Triage Vital Signs ED Triage Vitals  Enc Vitals Group     BP 12/11/20 1916 126/75     Pulse Rate 12/11/20 1916 90     Resp 12/11/20 1916 19     Temp 12/11/20 1916 97.9 F (36.6 C)     Temp Source 12/11/20 1916 Oral     SpO2 12/11/20 1916 100 %     Weight --      Height --      Head Circumference --      Peak Flow --      Pain Score 12/11/20 1914 6     Pain Loc --      Pain Edu? --      Excl. in GC? --    No data found.  Updated  Vital Signs BP 126/75 (BP Location: Right Arm)   Pulse 90   Temp 97.9 F (36.6 C) (Oral)   Resp 19   LMP 11/12/2020 (Approximate)   SpO2 100%   Visual Acuity Right Eye Distance:   Left Eye Distance:   Bilateral Distance:    Right Eye Near:   Left Eye Near:    Bilateral Near:     Physical Exam Vitals and nursing note reviewed.  Constitutional:      Appearance: Normal appearance. She is not ill-appearing.  HENT:     Head: Atraumatic.  Eyes:     Extraocular Movements: Extraocular movements intact.     Conjunctiva/sclera: Conjunctivae normal.  Cardiovascular:     Rate and Rhythm: Normal rate and regular rhythm.     Heart sounds: Normal heart sounds.  Pulmonary:     Effort: Pulmonary effort is normal.     Breath sounds: Normal breath sounds.  Abdominal:     General: Bowel sounds are normal. There is no distension.     Palpations: Abdomen is soft.     Tenderness: There is no abdominal tenderness. There is no right CVA tenderness, left CVA tenderness or guarding.  Genitourinary:    Comments: GU exam deferred, self swab performed Musculoskeletal:        General: Normal range of motion.     Cervical back:  Normal range of motion and neck supple.  Skin:    General: Skin is warm and dry.  Neurological:     Mental Status: She is alert and oriented to person, place, and time.  Psychiatric:        Mood and Affect: Mood normal.        Thought Content: Thought content normal.        Judgment: Judgment normal.      UC Treatments / Results  Labs (all labs ordered are listed, but only abnormal results are displayed) Labs Reviewed  CERVICOVAGINAL ANCILLARY ONLY - Abnormal; Notable for the following components:      Result Value   Bacterial Vaginitis (gardnerella) Positive (*)    Candida Vaginitis Positive (*)    All other components within normal limits    EKG   Radiology No results found.  Procedures Procedures (including critical care time)  Medications Ordered in UC Medications - No data to display  Initial Impression / Assessment and Plan / UC Course  I have reviewed the triage vital signs and the nursing notes.  Pertinent labs & imaging results that were available during my care of the patient were reviewed by me and considered in my medical decision making (see chart for details).     We will start treatment with Diflucan for a yeast infection while awaiting the vaginal swab results.  Also discussed boric acid suppositories over-the-counter to help with prevention and vaginal pH.  Safe sexual practices good vaginal hygiene reviewed.  Final Clinical Impressions(s) / UC Diagnoses   Final diagnoses:  Acute vaginitis  Routine screening for STI (sexually transmitted infection)     Discharge Instructions     May try boric acid suppositories over-the-counter as needed    ED Prescriptions    Medication Sig Dispense Auth. Provider   fluconazole (DIFLUCAN) 150 MG tablet Take 1 tablet (150 mg total) by mouth every other day. 3 tablet Particia Nearing, [place]     PDMP not reviewed this encounter.   Particia Nearing, [place] 12/12/20 2046

## 2020-12-13 ENCOUNTER — Telehealth (HOSPITAL_COMMUNITY): Payer: Self-pay | Admitting: Emergency Medicine

## 2020-12-13 MED ORDER — METRONIDAZOLE 500 MG PO TABS: 500.0000 mg | ORAL_TABLET | Freq: Two times a day (BID) | ORAL | 0 refills | Status: DC

## 2021-08-13 ENCOUNTER — Other Ambulatory Visit: Payer: Self-pay

## 2021-08-13 ENCOUNTER — Emergency Department (HOSPITAL_COMMUNITY)
Admission: EM | Admit: 2021-08-13 | Discharge: 2021-08-14 | Disposition: A | Discharge status: Home / Self Care | Payer: Medicaid Other | Attending: Student | Admitting: Student

## 2021-08-13 ENCOUNTER — Encounter (HOSPITAL_COMMUNITY): Payer: Self-pay

## 2021-08-13 DIAGNOSIS — N898 Other specified noninflammatory disorders of vagina: Secondary | ICD-10-CM | POA: Diagnosis not present

## 2021-08-13 DIAGNOSIS — Z5321 Procedure and treatment not carried out due to patient leaving prior to being seen by health care provider: Secondary | ICD-10-CM | POA: Insufficient documentation

## 2021-08-13 DIAGNOSIS — Z202 Contact with and (suspected) exposure to infections with a predominantly sexual mode of transmission: Secondary | ICD-10-CM | POA: Diagnosis not present

## 2021-08-13 DIAGNOSIS — N7689 Other specified inflammation of vagina and vulva: Secondary | ICD-10-CM | POA: Diagnosis present

## 2021-08-13 LAB — URINALYSIS, ROUTINE W REFLEX MICROSCOPIC
Bilirubin Urine: NEGATIVE
Glucose, UA: NEGATIVE mg/dL
Hgb urine dipstick: NEGATIVE
Ketones, ur: NEGATIVE mg/dL
Nitrite: NEGATIVE
Protein, ur: NEGATIVE mg/dL
Specific Gravity, Urine: 1.033 — ABNORMAL HIGH (ref 1.005–1.030)
pH: 5 (ref 5.0–8.0)

## 2021-08-13 NOTE — ED Provider Notes (Signed)
Emergency Medicine Provider Triage Evaluation Note  Sheri Nunez , a 20 y.o. female  was evaluated in triage.  Pt complains of vaginal discomfort for the past 2 days.  Sexually active, but would like to be fully evaluated for STDs.  Last menstrual period 2 weeks ago.  Review of Systems  Positive: Vaginal discharge Negative: Urinary symptoms  Physical Exam  BP (!) 119/96 (BP Location: Left Arm)   Pulse (!) 110   Temp 99.3 F (37.4 C) (Oral)   Resp 16   Ht 5\' 8"  (1.727 m)   Wt 89.7 kg   SpO2 99%   BMI 30.08 kg/m  Gen:   Awake, no distress   Resp:  Normal effort  MSK:   Moves extremities without difficulty  Other:    Medical Decision Making  Medically screening exam initiated at 8:13 PM.  Appropriate orders placed.  Sheri Nunez was informed that the remainder of the evaluation will be completed by another provider, this initial triage assessment does not replace that evaluation, and the importance of remaining in the ED until their evaluation is complete.     , PA-C 08/13/21 2014    08/15/21, MD 08/13/21 [phone]

## 2021-08-13 NOTE — ED Triage Notes (Signed)
Pt c/o vaginal discomfort since yesterday. Pt c/o discharge, states its thick and white. Pt states she has not been exposed to a STD that she is aware of, pt sexually active with boyfriend.

## 2021-08-14 ENCOUNTER — Encounter (HOSPITAL_COMMUNITY): Payer: Self-pay | Admitting: Emergency Medicine

## 2021-08-14 ENCOUNTER — Ambulatory Visit (HOSPITAL_COMMUNITY)
Admission: EM | Admit: 2021-08-14 | Discharge: 2021-08-14 | Disposition: A | Discharge status: Home / Self Care | Payer: Medicaid Other | Attending: Emergency Medicine | Admitting: Emergency Medicine

## 2021-08-14 DIAGNOSIS — N898 Other specified noninflammatory disorders of vagina: Secondary | ICD-10-CM | POA: Insufficient documentation

## 2021-08-14 DIAGNOSIS — N760 Acute vaginitis: Secondary | ICD-10-CM | POA: Diagnosis not present

## 2021-08-14 MED ORDER — METRONIDAZOLE 500 MG PO TABS
500.0000 mg | ORAL_TABLET | Freq: Two times a day (BID) | ORAL | 0 refills | Status: DC
Start: 2021-08-14 — End: 2022-05-13

## 2021-08-14 MED ORDER — FLUCONAZOLE 150 MG PO TABS: 150.0000 mg | ORAL_TABLET | ORAL | 0 refills | Status: DC

## 2021-08-14 NOTE — Discharge Instructions (Addendum)
Boric acid suppository   Labs pending 2-3 days, you will be contacted if positive for any sti and treatment will be sent to the pharmacy, you will have to return to the clinic if positive for gonorrhea to receive treatment   Please refrain from having sex until labs results, if positive please refrain from having sex until treatment complete and symptoms resolve   If positive for HIV, Syphilis, Chlamydia  gonorrhea or trichomoniasis please notify partner or partners so they may tested as well  Moving forward, it is recommended you use some form of protection against the transmission of sti infections  such as condoms or dental dams with each sexual encounter

## 2021-08-14 NOTE — ED Provider Notes (Signed)
[place]    CSN: [id number] Arrival date & time: 08/14/21  0831      History   Chief Complaint Chief Complaint  Patient presents with   Vaginal Discharge    HPI Sheri Nunez is a 20 y.o. female.   Patient presents with thick Geena Weinhold vaginal discharge for 2 days. Vaginal itching and irritation began 1 day ago. Sexually active, one partner, no condom use.  Denies urinary frequency, urinary urgency, hematuria, abdominal pain, flank pain, dysuria, nausea, vomiting, diarrhea, constipation, fever, chills . Endorses frequent recurrence of yeast and BV practicing good hygiene.  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 Authorizing Provider  fluconazole (DIFLUCAN) 150 MG tablet Take 1 tablet (150 mg total) by mouth every other day. 12/11/20   Volney American, PA-C  metroNIDAZOLE (FLAGYL) 500 MG tablet Take 1 tablet (500 mg total) by mouth 2 (two) times daily. 12/13/20   LampteyMyrene Galas, MD  Prenatal Vit-Fe Fumarate-FA (PRENATAL VITAMINS) 28-0.8 MG TABS Take one tablet daily 09/19/20   Carollee Leitz, MD    Family History Family History  Problem Relation Age of Onset   Healthy  Mother    Healthy Father     Social History Social History   Tobacco Use   Smoking status: Never   Smokeless tobacco: Never  Vaping Use   Vaping Use: Never used  Substance Use Topics   Alcohol use: Yes   Drug use: No     Allergies   Patient has no known allergies.   Review of Systems Review of Systems  Constitutional: Negative.   Gastrointestinal: Negative.   Genitourinary:  Positive for vaginal discharge. Negative for decreased urine volume, difficulty urinating, dyspareunia, dysuria, enuresis, flank pain, frequency, genital sores, hematuria, menstrual problem, pelvic pain, urgency, vaginal bleeding and vaginal pain.  Skin: Negative.     Physical Exam Triage Vital Signs ED Triage Vitals  Enc Vitals Group     BP 08/14/21 0936 110/72     Pulse Rate 08/14/21 0936 82     Resp 08/14/21 0936 15     Temp 08/14/21 0936 98.4 F (36.9 C)     Temp Source 08/14/21 0936 Oral     SpO2 08/14/21 0936 98 %     Weight --      Height --      Head Circumference --  Peak Flow --      Pain Score 08/14/21 0935 5     Pain Loc --      Pain Edu? --      Excl. in GC? --    No data found.  Updated Vital Signs BP 110/72 (BP Location: Left Arm)   Pulse 82   Temp 98.4 F (36.9 C) (Oral)   Resp 15   LMP 08/04/2021 (Approximate)   SpO2 98%   Visual Acuity Right Eye Distance:   Left Eye Distance:   Bilateral Distance:    Right Eye Near:   Left Eye Near:    Bilateral Near:     Physical Exam Constitutional:      Appearance: Normal appearance. She is normal weight.  HENT:     Head: Normocephalic.  Eyes:     Extraocular Movements: Extraocular movements intact.  Pulmonary:     Effort: Pulmonary effort is normal.  Abdominal:     General: Abdomen is flat. Bowel sounds are normal.     Palpations: Abdomen is soft.  Genitourinary:    Comments: Deferred self collect vaginal swab Skin:    General: Skin is warm and dry.  Neurological:     Mental Status: She is alert and  oriented to person, place, and time. Mental status is at baseline.  Psychiatric:        Mood and Affect: Mood normal.        Behavior: Behavior normal.     UC Treatments / Results  Labs (all labs ordered are listed, but only abnormal results are displayed) Labs Reviewed - No data to display  EKG   Radiology No results found.  Procedures Procedures (including critical care time)  Medications Ordered in UC Medications - No data to display  Initial Impression / Assessment and Plan / UC Course  I have reviewed the triage vital signs and the nursing notes.  Pertinent labs & imaging results that were available during my care of the patient were reviewed by me and considered in my medical decision making (see chart for details).  Vaginal discharge  We will treat prophylactically for BV and yeast due to reoccurrence of infection  1.  Metronidazole 500 mg twice daily for 7 days 2.  Diflucan 150 mg once, 150 mg in 72 hours as needed 3.  STI screening pending will treat per protocol, advised abstinence until lab results complete and all symptoms have resolved, advised condom use moving forward 4.  Recommended cotton underwear, wiping from front to back, urination after all sexual encounters and boric acid suppositories as needed for prevention Final Clinical Impressions(s) / UC Diagnoses   Final diagnoses:  None   Discharge Instructions   None    ED Prescriptions   None    PDMP not reviewed this encounter.   Valinda Hoar, NP 08/14/21 1016

## 2021-08-14 NOTE — ED Triage Notes (Signed)
Pt having some vaginal itching, pain and white discharge for couple days.

## 2021-08-15 LAB — CERVICOVAGINAL ANCILLARY ONLY
Bacterial Vaginitis (gardnerella): NEGATIVE
Candida Glabrata: NEGATIVE
Candida Vaginitis: POSITIVE — AB
Chlamydia: NEGATIVE
Comment: NEGATIVE
Comment: NEGATIVE
Comment: NEGATIVE
Comment: NEGATIVE
Comment: NEGATIVE
Comment: NORMAL
Neisseria Gonorrhea: NEGATIVE
Trichomonas: NEGATIVE

## 2022-02-17 ENCOUNTER — Encounter: Payer: Self-pay | Admitting: *Deleted

## 2022-05-13 ENCOUNTER — Ambulatory Visit (HOSPITAL_COMMUNITY)
Admission: EM | Admit: 2022-05-13 | Discharge: 2022-05-13 | Disposition: A | Discharge status: Home / Self Care | Payer: Medicaid Other | Attending: Family Medicine | Admitting: Family Medicine

## 2022-05-13 ENCOUNTER — Encounter (HOSPITAL_COMMUNITY): Payer: Self-pay

## 2022-05-13 DIAGNOSIS — N898 Other specified noninflammatory disorders of vagina: Secondary | ICD-10-CM | POA: Diagnosis not present

## 2022-05-13 DIAGNOSIS — Z113 Encounter for screening for infections with a predominantly sexual mode of transmission: Secondary | ICD-10-CM | POA: Diagnosis not present

## 2022-05-13 MED ORDER — FLUCONAZOLE 150 MG PO TABS: ORAL_TABLET | ORAL | 0 refills | Status: DC

## 2022-05-13 MED ORDER — METRONIDAZOLE 500 MG PO TABS: 500.0000 mg | ORAL_TABLET | Freq: Two times a day (BID) | ORAL | 0 refills | Status: DC

## 2022-05-13 NOTE — ED Provider Notes (Signed)
[facility]   [id number] 05/13/22 Arrival Time: 1001  ASSESSMENT & PLAN:  1. Vaginal irritation   2. Screening for STDs (sexually transmitted diseases)    Empiric tx for BV/yeast. Begin; Meds ordered this encounter  Medications   fluconazole (DIFLUCAN) 150 MG tablet    Sig: Take one tablet by mouth as a single dose. May repeat in 3 days if symptoms persist.    Dispense:  2 tablet    Refill:  0   metroNIDAZOLE (FLAGYL) 500 MG tablet    Sig: Take 1 tablet (500 mg total) by mouth 2 (two) times daily.    Dispense:  14 tablet    Refill:  0      Discharge Instructions      We have sent testing for sexually transmitted infections. We will notify you of any positive results once they are received. If required, we will prescribe any medications you might need.  Please refrain from all sexual activity for at least the next seven days.     Without s/s of PID.  Labs Reviewed  CERVICOVAGINAL ANCILLARY ONLY    Will notify of any positive results. Instructed to refrain from sexual activity for at least seven days.  Reviewed expectations re: course of current medical issues. Questions answered. Outlined signs and symptoms indicating need for more acute intervention. Patient verbalized understanding. After Visit Summary given.   SUBJECTIVE:  Sheri Nunez is a 21 y.o. female who presents with complaint of vaginal irritation/scant discharge. "Have had BV and yeast before; feels the same".. Onset gradual. First noticed  2 d ago . Mild odor. No specific aggravating or alleviating factors reported. Denies: urinary frequency, dysuria, and gross hematuria. Afebrile. No abdominal or pelvic pain. Normal PO intake wihout n/v. No genital rashes or lesions. Reports that she is sexually active. No tx PTA.  Patient's last menstrual period was 05/12/2022.   OBJECTIVE:  Vitals:   05/13/22 1121 05/13/22 1124  BP: 114/86   Pulse: 71   Resp: 12   Temp: 98.3 F (36.8 C)    SpO2: 98%   Weight:  77.1 kg  Height:  5\' 8"  (1.727 m)     General appearance: alert, cooperative, appears stated age and no distress Lungs: unlabored respirations; speaks full sentences without difficulty Back: no CVA tenderness; FROM at waist Abdomen: soft, non-tender GU: deferred Skin: warm and dry Psychological: alert and cooperative; normal mood and affect.    Labs Reviewed  CERVICOVAGINAL ANCILLARY ONLY    No Known Allergies  Past Medical History:  Diagnosis Date   UTI (urinary tract infection)    Family History  Problem Relation Age of Onset   Healthy Mother    Healthy Father    Social History   Socioeconomic History   Marital status: Single    Spouse name: Not on file   Number of children: Not on file   Years of education: Not on file   Highest education level: Not on file  Occupational History   Not on file  Tobacco Use   Smoking status: Never   Smokeless tobacco: Never  Vaping Use   Vaping Use: Never used  Substance and Sexual Activity   Alcohol use: Yes   Drug use: No   Sexual activity: Not Currently    Birth control/protection: None  Other Topics Concern   Not on file  Social History Narrative   Not on file   Social Determinants of Health   Financial Resource Strain: Not on file  Food Insecurity: Not on file  Transportation Needs: Not on file  Physical Activity: Not on file  Stress: Not on file  Social Connections: Not on file  Intimate Partner Violence: Not on file           Mardella Layman, MD 05/13/22 1219

## 2022-05-13 NOTE — ED Triage Notes (Signed)
Pt is here for possible yeast. Pt state she has irritation in the vaginal area with some discharge  looking like (cottage cheese) , and odor. X2days. Pt denies burning sensation

## 2022-05-13 NOTE — Discharge Instructions (Signed)
We have sent testing for sexually transmitted infections. We will notify you of any positive results once they are received. If required, we will prescribe any medications you might need.  Please refrain from all sexual activity for at least the next seven days.  

## 2022-05-14 LAB — CERVICOVAGINAL ANCILLARY ONLY
Bacterial Vaginitis (gardnerella): NEGATIVE
Candida Glabrata: NEGATIVE
Candida Vaginitis: NEGATIVE
Chlamydia: NEGATIVE
Comment: NEGATIVE
Comment: NEGATIVE
Comment: NEGATIVE
Comment: NEGATIVE
Comment: NEGATIVE
Comment: NORMAL
Neisseria Gonorrhea: NEGATIVE
Trichomonas: NEGATIVE

## 2022-07-30 ENCOUNTER — Encounter (HOSPITAL_COMMUNITY): Payer: Self-pay | Admitting: *Deleted

## 2022-07-30 ENCOUNTER — Ambulatory Visit (HOSPITAL_COMMUNITY)
Admission: EM | Admit: 2022-07-30 | Discharge: 2022-07-30 | Disposition: A | Discharge status: Home / Self Care | Payer: Medicaid Other | Attending: Sports Medicine | Admitting: Sports Medicine

## 2022-07-30 DIAGNOSIS — N898 Other specified noninflammatory disorders of vagina: Secondary | ICD-10-CM | POA: Diagnosis not present

## 2022-07-30 LAB — HIV ANTIBODY (ROUTINE TESTING W REFLEX): HIV Screen 4th Generation wRfx: NONREACTIVE

## 2022-07-30 NOTE — ED Triage Notes (Signed)
Pt states she has been having vaginal discharge for 2 days with a foul smell. She has no known exposures but would like all testing done. She did get waxed and now has a bump that is white and small.

## 2022-07-30 NOTE — ED Provider Notes (Signed)
[facility]    CSN: [id number] Arrival date & time: 07/30/22  1048      History   Chief Complaint Chief Complaint  Patient presents with   SEXUALLY TRANSMITTED DISEASE    HPI Sheri Nunez is a 21 y.o. female.   She presents today with chief complaint of vaginal discharge for the past 2 days and malodorous discharge for 1 day.  She reports this is thin clear to white discharge and she also has 1 small raised lesion with a whitehead that she thinks maybe occurred after getting waxed.  She has been drinking cranberry juice to help with her symptoms however she has not had any improvement.  She denies any pain to the area, dysuria, new sexual partners.  She reports she has had BV 3-4 times this year.    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 Authorizing Provider  fluconazole (DIFLUCAN) 150 MG tablet Take one tablet by mouth as a single dose. May repeat in 3 days if symptoms persist. 05/13/22   Mardella Layman, MD  metroNIDAZOLE (FLAGYL) 500 MG tablet Take 1 tablet (500 mg total) by mouth 2 (two) times daily. 05/13/22   Mardella Layman, MD    Family History Family History  Problem  Relation Age of Onset   Healthy Mother    Healthy Father     Social History Social History   Tobacco Use   Smoking status: Never   Smokeless tobacco: Never  Vaping Use   Vaping Use: Never used  Substance Use Topics   Alcohol use: Yes   Drug use: No     Allergies   Patient has no known allergies.   Review of Systems Review of Systems As listed above in HPI   Physical Exam Triage Vital Signs ED Triage Vitals  Enc Vitals Group     BP 07/30/22 1201 127/76     Pulse Rate 07/30/22 1201 74     Resp 07/30/22 1201 18     Temp 07/30/22 1201 98.1 F (36.7 C)     Temp Source 07/30/22 1201 Oral     SpO2 07/30/22 1201 94 %     Weight --      Height --      Head Circumference --      Peak Flow --      Pain Score 07/30/22 1200 0  Pain Loc --      Pain Edu? --      Excl. in GC? --    No data found.  Updated Vital Signs BP 127/76 (BP Location: Left Arm)   Pulse 74   Temp 98.1 F (36.7 C) (Oral)   Resp 18   LMP 07/08/2022 (Approximate)   SpO2 94%    Physical Exam Vitals reviewed. Exam conducted with a chaperone present (Nurse Selena Batten).  Constitutional:      General: She is not in acute distress.    Appearance: Normal appearance. She is normal weight. She is not ill-appearing, toxic-appearing or diaphoretic.  HENT:     Head: Normocephalic.  Cardiovascular:     Rate and Rhythm: Normal rate.  Pulmonary:     Effort: Pulmonary effort is normal.  Genitourinary:    Exam position: Supine.     Labia:        Right: No rash or lesion.        Left: No rash or lesion.      Vagina: Vaginal discharge (thin grey discharge) present.       Comments: Very small pinpoint raised lesion with white head, in the area documented above Skin:    General: Skin is warm.  Neurological:     Mental Status: She is alert.      UC Treatments / Results  Labs (all labs ordered are listed, but only abnormal results are displayed) Labs Reviewed  RPR  HIV ANTIBODY (ROUTINE TESTING W  REFLEX)  CERVICOVAGINAL ANCILLARY ONLY    EKG   Radiology No results found.  Procedures Procedures (including critical care time)  Medications Ordered in UC Medications - No data to display  Initial Impression / Assessment and Plan / UC Course  I have reviewed the triage vital signs and the nursing notes.  Pertinent labs & imaging results that were available during my care of the patient were reviewed by me and considered in my medical decision making (see chart for details).     Patient is here for vaginal discharge, vaginal swab and blood work for STI testing collected today.  I suspect this may be secondary to bacterial vaginosis, however we will await test results prior to treatment.  I believe this small pinpoint lesion with a whitehead near her gluteal area is more likely a clogged pore.  Very low concern for HSV lesion at this time. Urgent care staff will call with results and correct medicine for treatment.  She verbalized understanding. Final Clinical Impressions(s) / UC Diagnoses   Final diagnoses:  None   Discharge Instructions   None    ED Prescriptions   None    PDMP not reviewed this encounter.   Claudie Leach, DO 07/30/22 1242

## 2022-07-30 NOTE — ED Notes (Signed)
In room with providers examination of patient.  Verified cytology labeling and placed in lab

## 2022-07-30 NOTE — Discharge Instructions (Signed)
Urgent care staff will call you with the results of your vaginal swab and blood work for STI testing.  They will send medication according to results.  Recommend avoiding sexual intercourse until results have come back and you have completed treatment as well as your partner as appropriate.  Recommend follow-up with a primary care provider or OB/GYN to continue the discussion of recurrent BV and further treatments.

## 2022-07-31 LAB — CERVICOVAGINAL ANCILLARY ONLY
Bacterial Vaginitis (gardnerella): NEGATIVE
Candida Glabrata: NEGATIVE
Candida Vaginitis: NEGATIVE
Chlamydia: NEGATIVE
Comment: NEGATIVE
Comment: NEGATIVE
Comment: NEGATIVE
Comment: NEGATIVE
Comment: NEGATIVE
Comment: NORMAL
Neisseria Gonorrhea: NEGATIVE
Trichomonas: NEGATIVE

## 2022-07-31 LAB — RPR: RPR Ser Ql: NONREACTIVE

## 2022-09-20 ENCOUNTER — Encounter (HOSPITAL_COMMUNITY): Payer: Self-pay | Admitting: *Deleted

## 2022-09-20 ENCOUNTER — Ambulatory Visit (HOSPITAL_COMMUNITY)
Admission: EM | Admit: 2022-09-20 | Discharge: 2022-09-20 | Disposition: A | Discharge status: Home / Self Care | Payer: Medicaid Other | Attending: Emergency Medicine | Admitting: Emergency Medicine

## 2022-09-20 ENCOUNTER — Other Ambulatory Visit: Payer: Self-pay

## 2022-09-20 DIAGNOSIS — N898 Other specified noninflammatory disorders of vagina: Secondary | ICD-10-CM | POA: Diagnosis present

## 2022-09-20 DIAGNOSIS — N760 Acute vaginitis: Secondary | ICD-10-CM | POA: Insufficient documentation

## 2022-09-20 DIAGNOSIS — B9689 Other specified bacterial agents as the cause of diseases classified elsewhere: Secondary | ICD-10-CM | POA: Diagnosis not present

## 2022-09-20 DIAGNOSIS — U071 COVID-19: Secondary | ICD-10-CM | POA: Insufficient documentation

## 2022-09-20 DIAGNOSIS — J069 Acute upper respiratory infection, unspecified: Secondary | ICD-10-CM

## 2022-09-20 LAB — POCT RAPID STREP A, ED / UC: Streptococcus, Group A Screen (Direct): NEGATIVE

## 2022-09-20 MED ORDER — METRONIDAZOLE 500 MG PO TABS: 500.0000 mg | ORAL_TABLET | Freq: Two times a day (BID) | ORAL | 0 refills | Status: AC

## 2022-09-20 MED ORDER — FLUTICASONE PROPIONATE 50 MCG/ACT NA SUSP: 2.0000 | Freq: Every day | NASAL | 0 refills | Status: DC

## 2022-09-20 NOTE — ED Triage Notes (Signed)
Pt reports VAG discharge and sore throat for 2 days.

## 2022-09-20 NOTE — ED Provider Notes (Signed)
HPI  SUBJECTIVE:  Sheri Nunez is a 22 y.o. female who presents with 2 issues: First, she reports cough, headache, sore throat, nasal congestion, rhinorrhea for the past 3 days.  No fevers, bodies, postnasal drip, wheezing or shortness of breath, drooling, trismus, neck stiffness, sensation of throat swelling shut, difficulty breathing, voice changes, rash in her throat.  No antibiotics in the past month.  No antipyretic in the past 6 hours.  No known strep, COVID, flu, mono exposure.  She did not get the COVID-vaccine.  She got this years flu vaccine.  She has tried TheraFlu, tea and Mucinex with improvement in her symptoms.  Symptoms worse with lying down.  Second, she reports a thick, milky, odorous vaginal discharge starting yesterday.  She reports occasional "tingling".  No itching, rash, blisters, abnormal bleeding, abdominal, back, pelvic pain.  Denies recent oral sex.  She is in a long-term monogamous relationship with a female, who is asymptomatic.  However, STDs are a concern today.  She tried boric acid vaginal suppository which made things worse.  No alleviating factors.  She has a past medical history of chlamydia, frequent BV and yeast.  No history of gonorrhea, HIV, HSV, syphilis, trichomonas, asthma, diabetes.  LMP: Last month.  Denies the possibility of being pregnant.  PCP: [facility]    Past Medical History:  Diagnosis Date   UTI (urinary tract infection)     Past Surgical History:  Procedure Laterality Date   NO PAST SURGERIES      Family History  Problem Relation Age of Onset   Healthy Mother    Healthy Father     Social History   Tobacco Use   Smoking status: Never   Smokeless tobacco: Never  Vaping Use   Vaping Use: Never used  Substance Use Topics   Alcohol use: Yes   Drug use: No    No current facility-administered medications for this encounter.  Current Outpatient Medications:    fluticasone (FLONASE) 50 MCG/ACT nasal spray, Place 2  sprays into both nostrils Nunez., Disp: 16 g, Rfl: 0   metroNIDAZOLE (FLAGYL) 500 MG tablet, Take 1 tablet (500 mg total) by mouth 2 (two) times Nunez for 7 days., Disp: 14 tablet, Rfl: 0  No Known Allergies   ROS  As noted in HPI.   Physical Exam  BP 126/83   Pulse 84   Temp 99.2 F (37.3 C)   Resp 18   LMP 08/15/2022   SpO2 100%   Constitutional: Well developed, well nourished, no acute distress Eyes:  EOMI, conjunctiva normal bilaterally HENT: Normocephalic, atraumatic,mucus membranes moist.  Positive nasal congestion.  Normal oropharynx, tonsils without exudates.  Uvula midline.  Positive cobblestoning.  No postnasal drip. Neck: Positive cervical lymphadenopathy Respiratory: Normal inspiratory effort, lungs clear bilaterally Cardiovascular: Normal rate, regular rhythm, no murmurs GI: nondistended soft, nontender, no splenomegaly, no suprapubic tenderness Back: No CVAT skin: No rash, skin intact Musculoskeletal: no deformities Neurologic: Alert & oriented x 3, no focal neuro deficits Psychiatric: Speech and behavior appropriate   ED Course   Medications - No data to display  Orders Placed This Encounter  Procedures   SARS CORONAVIRUS 2 (TAT 6-24 HRS) Anterior Nasal Swab    Standing Status:   Standing    Number of Occurrences:   1   POCT Rapid Strep A    Standing Status:   Standing    Number of Occurrences:   1   Results for orders placed or performed during the  hospital encounter of 09/20/22  SARS CORONAVIRUS 2 (TAT 6-24 HRS) Anterior Nasal Swab   Specimen: Anterior Nasal Swab  Result Value Ref Range   SARS Coronavirus 2 POSITIVE (A) NEGATIVE  POCT Rapid Strep A  Result Value Ref Range   Streptococcus, Group A Screen (Direct) NEGATIVE NEGATIVE    No results found for this or any previous visit (from the past 24 hour(s)).  No results found.  ED Clinical Impression  1. COVID-19 virus infection   2. Vaginal discharge   3. BV (bacterial vaginosis)       ED Assessment/Plan     1.  URI.  Checking COVID, strep.  Will prescribe antiviral if COVID is positive as she is unvaccinated.  Also checking strep.  Will treat accordingly.  In the meantime, Flonase, Benadryl/Maalox mixture, Mucinex D, saline nasal irrigation.  Strep negative. COVID positive.  Will have staff reach out to patient and discussed antiviral treatment with her.  2.  Vaginal discharge.  Suspect BV.  Sending home with Flagyl.  Early herpes in the differential with flulike symptoms.  Patient denies having oral sex, deferring swab for gonorrhea of the throat.  Will base further treatment off of the rest of the labs.  Vaginal swab for STDs sent.  Follow-up with PCP as needed.  Discussed labs, MDM, treatment plan, and plan for follow-up with patient. Discussed sn/sx that should prompt return to the ED. patient agrees with plan.   Meds ordered this encounter  Medications   metroNIDAZOLE (FLAGYL) 500 MG tablet    Sig: Take 1 tablet (500 mg total) by mouth 2 (two) times Nunez for 7 days.    Dispense:  14 tablet    Refill:  0   fluticasone (FLONASE) 50 MCG/ACT nasal spray    Sig: Place 2 sprays into both nostrils Nunez.    Dispense:  16 g    Refill:  0      *This clinic note was created using Lobbyist. Therefore, there may be occasional mistakes despite careful proofreading.  ?    Melynda Ripple, MD 09/21/22 1700

## 2022-09-20 NOTE — Discharge Instructions (Signed)
Finish the Flagyl, even if you feel better.  You can discontinue it if your testing comes back negative for BV.  We will treat accordingly based on the results of the rest of your labs.  For the upper respiratory infection-checking a COVID, rapid strep.  I will call in An antiviral if your COVID is positive and a antibiotic if strep is positive.  Saline nasal irrigation with a Milta Deiters Med rinse with distilled water as often as you want.  Mucinex D, Flonase.  Make sure you drink plenty of extra fluids.  Some people find salt water gargles and  Traditional Medicinal's "Throat Coat" tea helpful. Take 5 mL of liquid Benadryl and 5 mL of Maalox. Mix it together, and then hold it in your mouth for as long as you can and then swallow. You may do this 4 times a day.  Honey and lemon dissolved in hot water can also be soothing.  Go to www.goodrx.com  or www.costplusdrugs.com to look up your medications. This will give you a list of where you can find your prescriptions at the most affordable prices. Or ask the pharmacist what the cash price is, or if they have any other discount programs available to help make your medication more affordable. This can be less expensive than what you would pay with insurance.

## 2022-09-21 LAB — SARS CORONAVIRUS 2 (TAT 6-24 HRS): SARS Coronavirus 2: POSITIVE — AB

## 2022-09-24 ENCOUNTER — Telehealth (HOSPITAL_COMMUNITY): Payer: Self-pay | Admitting: Emergency Medicine

## 2022-09-24 ENCOUNTER — Ambulatory Visit (HOSPITAL_COMMUNITY)
Admission: EM | Admit: 2022-09-24 | Discharge: 2022-09-24 | Disposition: A | Discharge status: Home / Self Care | Payer: Medicaid Other | Attending: Emergency Medicine | Admitting: Emergency Medicine

## 2022-09-24 DIAGNOSIS — B9689 Other specified bacterial agents as the cause of diseases classified elsewhere: Secondary | ICD-10-CM | POA: Insufficient documentation

## 2022-09-24 DIAGNOSIS — N898 Other specified noninflammatory disorders of vagina: Secondary | ICD-10-CM | POA: Diagnosis present

## 2022-09-24 LAB — CERVICOVAGINAL ANCILLARY ONLY
Bacterial Vaginitis (gardnerella): NEGATIVE
Candida Glabrata: NEGATIVE
Candida Vaginitis: NEGATIVE
Chlamydia: NEGATIVE
Comment: NEGATIVE
Comment: NEGATIVE
Comment: NEGATIVE
Comment: NEGATIVE
Comment: NEGATIVE
Trichomonas: NEGATIVE

## 2022-09-24 NOTE — Telephone Encounter (Signed)
Patient will need to return for recollect of cyto swab for Gonorrhea Reviewed with patient, she verbalized understanding

## 2022-09-24 NOTE — ED Triage Notes (Signed)
Pt presents to the office for a recollection of cytology swab.

## 2022-09-25 LAB — CERVICOVAGINAL ANCILLARY ONLY
Bacterial Vaginitis (gardnerella): NEGATIVE
Candida Glabrata: NEGATIVE
Candida Vaginitis: NEGATIVE
Chlamydia: NEGATIVE
Comment: NEGATIVE
Comment: NEGATIVE
Comment: NEGATIVE
Comment: NEGATIVE
Comment: NEGATIVE
Comment: NORMAL
Neisseria Gonorrhea: NEGATIVE
Trichomonas: NEGATIVE

## 2022-09-26 ENCOUNTER — Ambulatory Visit
Admission: EM | Admit: 2022-09-26 | Discharge: 2022-09-26 | Disposition: A | Discharge status: Home / Self Care | Payer: Medicaid Other | Attending: Emergency Medicine | Admitting: Emergency Medicine

## 2022-09-26 DIAGNOSIS — N760 Acute vaginitis: Secondary | ICD-10-CM | POA: Insufficient documentation

## 2022-09-26 DIAGNOSIS — Z113 Encounter for screening for infections with a predominantly sexual mode of transmission: Secondary | ICD-10-CM | POA: Insufficient documentation

## 2022-09-26 DIAGNOSIS — R8281 Pyuria: Secondary | ICD-10-CM | POA: Diagnosis not present

## 2022-09-26 LAB — POCT URINALYSIS DIP (MANUAL ENTRY)
Bilirubin, UA: NEGATIVE
Blood, UA: NEGATIVE
Glucose, UA: NEGATIVE mg/dL
Ketones, POC UA: NEGATIVE mg/dL
Nitrite, UA: NEGATIVE
Spec Grav, UA: >=1.03 — AB (ref 1.010–1.025)
Urobilinogen, UA: 0.2 E.U./dL
pH, UA: 6 (ref 5.0–8.0)

## 2022-09-26 LAB — POCT URINE PREGNANCY: Preg Test, Ur: NEGATIVE

## 2022-09-26 NOTE — ED Triage Notes (Signed)
Pt c/o vaginal discharge  with odor. Pt requesting STD testing.   Stated: about a week ago

## 2022-09-26 NOTE — Discharge Instructions (Signed)
The urinalysis that we performed in the clinic today was abnormal.  Urine culture will be performed per our protocol.  The result of the urine culture will be available in the next 3 to 5 days and will be posted to your MyChart account.  If there is an abnormal finding, you will be contacted by phone and advised of further treatment recommendations, if any.   The results of your vaginal swab test which screens for BV, yeast, gonorrhea, chlamydia and trichomonas will be made posted to your MyChart account once it is complete.  This typically takes 2 to 4 days.  Please abstain from sexual intercourse of any kind, vaginal, oral or anal, until you have received the results of your STD testing.     If any of your results are abnormal, you will receive a phone call regarding treatment.  Prescriptions, if any are needed, will be provided for you at your pharmacy.     Your urine pregnancy test today is negative.   If you have not had complete resolution of your symptoms after completing any recommended treatment or if your symptoms worsen, please return for repeat evaluation.   Thank you for visiting urgent care today.  I appreciate the opportunity to participate in your care.

## 2022-09-26 NOTE — ED Provider Notes (Addendum)
[facility]    CSN: [id number] Arrival date & time: 09/26/22  0903    HISTORY   Chief Complaint  Patient presents with   SEXUALLY TRANSMITTED DISEASE   HPI Sheri Nunez is a pleasant, 22 y.o. female who presents to urgent care today. Patient complains of 1 week history of foul-smelling vaginal discharge.  Patient is requesting STD testing today.  Patient endorses abnormal vaginal discharge, vaginal discharge with fishy odor and possible exposure to STD.  Patient denies abnormal odor of urine, burning with urination, increased frequency of urination, suprapubic pain, perineal pain, flank pain, fever, chills, malaise, rigors, significant fatigue, vaginal irritation, dyspareunia, genital lesion(s), and known exposure to STD.  Patient was treated for presumed BV on September 20, 2022 with a 7-day course of Flagyl, vaginal swab test was negative for BV, yeast, gonorrhea, chlamydia and trichomonas.   The history is provided by the patient.   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 Authorizing Provider  fluticasone (FLONASE) 50 MCG/ACT nasal spray Place 2 sprays into both nostrils daily. 09/20/22    Melynda Ripple, MD  metroNIDAZOLE (FLAGYL) 500 MG tablet Take 1 tablet (500 mg total) by mouth 2 (two) times daily for 7 days. 09/20/22 09/27/22  Melynda Ripple, MD    Family History Family History  Problem Relation Age of Onset   Healthy Mother    Healthy Father    Social History Social History   Tobacco Use   Smoking status: Never   Smokeless tobacco: Never  Vaping Use   Vaping Use: Never used  Substance Use Topics   Alcohol use: Yes   Drug use: No   Allergies   Patient has no known allergies.  Review of Systems Review of Systems Pertinent findings revealed after performing a 14 point review of systems has been noted in the history of present illness.  Physical Exam Triage Vital Signs ED Triage Vitals  Enc Vitals Group     BP 07/11/21 0827 (!) 147/82     Pulse Rate 07/11/21 0827 72     Resp 07/11/21 0827 18     Temp 07/11/21 0827 98.3 F (36.8 C)     Temp Source 07/11/21 0827 Oral     SpO2 07/11/21 0827 98 %     Weight --      Height --      Head Circumference --      Peak  Flow --      Pain Score 07/11/21 0826 5     Pain Loc --      Pain Edu? --      Excl. in [place]? --   No data found.  Updated Vital Signs BP 122/76 (BP Location: Right Arm)   Pulse 96   Temp 98.2 F (36.8 C)   Resp 16   LMP 08/26/2022 (Approximate)   SpO2 96%   Physical Exam Vitals and nursing note reviewed.  Constitutional:      General: She is not in acute distress.    Appearance: Normal appearance. She is not ill-appearing.  HENT:     Head: Normocephalic and atraumatic.  Eyes:     General: Lids are normal.        Right eye: No discharge.        Left eye: No discharge.     Extraocular Movements: Extraocular movements intact.     Conjunctiva/sclera: Conjunctivae normal.     Right eye: Right conjunctiva is not injected.     Left eye: Left conjunctiva is not injected.  Neck:     Trachea: Trachea and phonation normal.  Cardiovascular:     Rate and Rhythm: Normal rate and  regular rhythm.     Pulses: Normal pulses.     Heart sounds: Normal heart sounds. No murmur heard.    No friction rub. No gallop.  Pulmonary:     Effort: Pulmonary effort is normal. No accessory muscle usage, prolonged expiration or respiratory distress.     Breath sounds: Normal breath sounds. No stridor, decreased air movement or transmitted upper airway sounds. No decreased breath sounds, wheezing, rhonchi or rales.  Chest:     Chest wall: No tenderness.  Genitourinary:    Comments: Patient politely declines pelvic exam today, patient provided a vaginal swab for testing. Musculoskeletal:        General: Normal range of motion.     Cervical back: Normal range of motion and neck supple. Normal range of motion.  Lymphadenopathy:     Cervical: No cervical adenopathy.  Skin:    General: Skin is warm and dry.     Findings: No erythema or rash.  Neurological:     General: No focal deficit present.     Mental Status: She is alert and oriented to person, place, and time.  Psychiatric:        Mood and Affect: Mood normal.        Behavior: Behavior normal.     Visual Acuity Right Eye Distance:   Left Eye Distance:   Bilateral Distance:    Right Eye Near:   Left Eye Near:    Bilateral Near:     UC Couse / Diagnostics / Procedures:     Radiology No results found.  Procedures Procedures (including critical care time) EKG  Pending results:  Labs Reviewed  POCT URINALYSIS DIP (MANUAL ENTRY) - Abnormal; Notable for the following components:      Result Value   Clarity, UA cloudy (*)    Spec Grav, UA >=1.030 (*)    Protein Ur, POC trace (*)    Leukocytes, UA Small (1+) (*)    All other components within normal limits  URINE CULTURE  HIV ANTIBODY (ROUTINE TESTING W REFLEX)  RPR  POCT URINE PREGNANCY  CERVICOVAGINAL ANCILLARY ONLY    Medications Ordered in UC: Medications - No data to display  UC Diagnoses / Final Clinical Impressions(s)   I have reviewed the triage  vital signs and the nursing notes.  Pertinent labs & imaging results that were available during my care of the patient were reviewed by me and considered in my medical decision making (see chart for details).    Final diagnoses:  Acute vaginitis  Screening examination for STD (sexually transmitted disease)  Pyuria   STD screening was performed, patient advised that the results be posted to their MyChart and if any of the results are positive, they will be notified by phone, further treatment will be provided as indicated based on results of STD screening. Patient was advised to abstain from sexual intercourse until that they receive the results of their STD testing.  Patient was also advised to use condoms to protect themselves from STD exposure. Urine pregnancy test was negative.  Urine dip today revealed pyuria, protein, cloudy appearance, and elevated specific gravity.  Urine culture will be performed per our protocol.   Patient advised that they will be contacted with results of the urine culture and that treatment will be provided as indicated based on results. Return precautions advised.  Please see discharge instructions below for details of plan of care as provided to patient. ED Prescriptions   None    PDMP not reviewed this encounter.  Disposition Upon Discharge:  Condition: stable for discharge home  Patient presented with concern for an acute illness with associated systemic symptoms and significant discomfort requiring urgent management. In my opinion, this is a condition that a prudent lay person (someone who possesses an average knowledge of health and medicine) may potentially expect to result in complications if not addressed urgently such as respiratory distress, impairment of bodily function or dysfunction of bodily organs.   As such, the patient has been evaluated and assessed, work-up was performed and treatment was provided in alignment with urgent care protocols  and evidence based medicine.  Patient/parent/caregiver has been advised that the patient may require follow up for further testing and/or treatment if the symptoms continue in spite of treatment, as clinically indicated and appropriate.  Routine symptom specific, illness specific and/or disease specific instructions were discussed with the patient and/or caregiver at length.  Prevention strategies for avoiding STD exposure were also discussed.  The patient will follow up with their current PCP if and as advised. If the patient does not currently have a PCP we will assist them in obtaining one.   The patient may need specialty follow up if the symptoms continue, in spite of conservative treatment and management, for further workup, evaluation, consultation and treatment as clinically indicated and appropriate.  Patient/parent/caregiver verbalized understanding and agreement of plan as discussed.  All questions were addressed during visit.  Please see discharge instructions below for further details of plan.  Discharge Instructions:   Discharge Instructions      The urinalysis that we performed in the clinic today was abnormal.  Urine culture will be performed per our protocol.  The result of the urine culture will be available in the next 3 to 5 days and will be posted to your MyChart account.  If there is an abnormal finding, you will be contacted by phone and advised of further treatment recommendations, if any.   The results of your vaginal swab test which screens for BV, yeast, gonorrhea, chlamydia and trichomonas will be made posted to your MyChart account once it is complete.  This typically takes 2 to 4 days.  Please abstain from sexual intercourse of any kind, vaginal, oral or anal, until you have received  the results of your STD testing.     If any of your results are abnormal, you will receive a phone call regarding treatment.  Prescriptions, if any are needed, will be provided for you  at your pharmacy.     Your urine pregnancy test today is negative.   If you have not had complete resolution of your symptoms after completing any recommended treatment or if your symptoms worsen, please return for repeat evaluation.   Thank you for visiting urgent care today.  I appreciate the opportunity to participate in your care.        This office note has been dictated using Teaching laboratory technician.  Unfortunately, this method of dictation can sometimes lead to typographical or grammatical errors.  I apologize for your inconvenience in advance if this occurs.  Please do not hesitate to reach out to me if clarification is needed.       Theadora Rama Scales, PA-C 09/26/22 0942    Theadora Rama Scales, PA-C 09/26/22 [phone]

## 2022-09-27 LAB — URINE CULTURE

## 2022-09-28 LAB — CERVICOVAGINAL ANCILLARY ONLY
Bacterial Vaginitis (gardnerella): NEGATIVE
Candida Glabrata: NEGATIVE
Candida Vaginitis: NEGATIVE
Chlamydia: NEGATIVE
Comment: NEGATIVE
Comment: NEGATIVE
Comment: NEGATIVE
Comment: NEGATIVE
Comment: NEGATIVE
Comment: NORMAL
Neisseria Gonorrhea: NEGATIVE
Trichomonas: NEGATIVE

## 2022-09-29 LAB — HIV ANTIBODY (ROUTINE TESTING W REFLEX): HIV Screen 4th Generation wRfx: NONREACTIVE

## 2022-09-29 LAB — RPR: RPR Ser Ql: NONREACTIVE

## 2022-10-10 ENCOUNTER — Ambulatory Visit
Admission: EM | Admit: 2022-10-10 | Discharge: 2022-10-10 | Disposition: A | Discharge status: Home / Self Care | Payer: Medicaid Other | Attending: Nurse Practitioner | Admitting: Nurse Practitioner

## 2022-10-10 DIAGNOSIS — J029 Acute pharyngitis, unspecified: Secondary | ICD-10-CM

## 2022-10-10 DIAGNOSIS — Z20828 Contact with and (suspected) exposure to other viral communicable diseases: Secondary | ICD-10-CM | POA: Diagnosis not present

## 2022-10-10 DIAGNOSIS — J020 Streptococcal pharyngitis: Secondary | ICD-10-CM | POA: Diagnosis not present

## 2022-10-10 LAB — POCT RAPID STREP A (OFFICE): Rapid Strep A Screen: POSITIVE — AB

## 2022-10-10 MED ORDER — AMOXICILLIN 500 MG PO CAPS: 500.0000 mg | ORAL_CAPSULE | Freq: Two times a day (BID) | ORAL | 0 refills | Status: AC

## 2022-10-10 MED ORDER — LIDOCAINE VISCOUS HCL 2 % MT SOLN: 15.0000 mL | Freq: Four times a day (QID) | OROMUCOSAL | 0 refills | Status: DC | PRN

## 2022-10-10 MED ORDER — OSELTAMIVIR PHOSPHATE 75 MG PO CAPS: 75.0000 mg | ORAL_CAPSULE | Freq: Every day | ORAL | 0 refills | Status: AC

## 2022-10-10 NOTE — ED Triage Notes (Signed)
Pt c/o tenderness in her throat.  Started: 2 days ago   Home interventions: none

## 2022-10-10 NOTE — Discharge Instructions (Addendum)
Amoxicillin twice daily for 10 days Tamiflu daily for for 7 days given your exposure to flu by your daughter Lidocaine gargle as needed.  Please gargle and spit do not swallow.  You may also water this down with a little bit of water if it is too thick Over-the-counter Tylenol or ibuprofen as needed Rest and fluids Clinical contact you with the result of your COVID testing is positive Follow-up with your PCP 2 to 3 days for recheck Please go to the emergency room if you have any worsening symptoms

## 2022-10-10 NOTE — ED Provider Notes (Addendum)
[facility]    CSN: [id number] Arrival date & time: 10/10/22  1330      History   Chief Complaint Chief Complaint  Patient presents with   Sore Throat    HPI Sheri Nunez is a 22 y.o. female who presents for evaluation of URI symptoms for 2 days. Patient reports associated symptoms of sore throat, throat congestion, the aches.  Denies N/V/D, cough, fevers, chills, ear pain, shortness of breath. Patient does not have a hx of asthma or smoking.  Daughter was recently treated for flu. no recent travel. Pt is vaccinated for COVID. Pt is vaccinated for flu this season. Pt has taken nothing OTC for symptoms.  Patient did test positive for COVID on 1/7.  Reports all symptoms resolved without complication.  Pt has no other concerns at this time.    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 Authorizing Provider  amoxicillin (AMOXIL) 500 MG capsule Take 1 capsule (500 mg total) by mouth 2 (two) times daily for 10 days. 10/10/22 10/20/22 Yes Radford Pax, NP  lidocaine (XYLOCAINE) 2 % solution Use as directed 15 mLs in the mouth or throat every 6 (six) hours as needed (Throat  pain). Gargle and spit do not swallow 10/10/22  Yes Radford Pax, NP  oseltamivir (TAMIFLU) 75 MG capsule Take 1 capsule (75 mg total) by mouth daily for 7 days. 10/10/22 10/17/22 Yes Radford Pax, NP  fluticasone (FLONASE) 50 MCG/ACT nasal spray Place 2 sprays into both nostrils daily. 09/20/22   Domenick Gong, MD    Family History Family History  Problem Relation Age of Onset   Healthy Mother    Healthy Father     Social History Social History   Tobacco Use   Smoking status: Never   Smokeless tobacco: Never  Vaping Use   Vaping Use: Never used  Substance Use Topics   Alcohol use: Yes   Drug use: No     Allergies   Patient has no known allergies.   Review of Systems Review of Systems  HENT:  Positive for sore throat.   Musculoskeletal:  Positive for myalgias.     Physical Exam Triage Vital Signs ED Triage Vitals  Enc Vitals Group     BP 10/10/22 1344 123/78     Pulse Rate 10/10/22 1344 83  Resp 10/10/22 1344 16     Temp 10/10/22 1344 98.4 F (36.9 C)     Temp Source 10/10/22 1344 Oral     SpO2 10/10/22 1344 95 %     Weight --      Height --      Head Circumference --      Peak Flow --      Pain Score 10/10/22 1356 5     Pain Loc --      Pain Edu? --      Excl. in [place]? --    No data found.  Updated Vital Signs BP 123/78 (BP Location: Left Arm)   Pulse 83   Temp 98.4 F (36.9 C) (Oral)   Resp 16   LMP 09/29/2022 (Approximate)   SpO2 95%   Breastfeeding No   Visual Acuity Right Eye Distance:   Left Eye Distance:   Bilateral Distance:    Right Eye Near:   Left Eye Near:    Bilateral Near:     Physical Exam Vitals and nursing note reviewed.  Constitutional:      General: She is not in acute distress.    Appearance: She is well-developed. She is not ill-appearing.  HENT:     Head: Normocephalic and atraumatic.     Right Ear: Tympanic membrane and ear canal normal.     Left Ear: Tympanic membrane and ear canal normal.     Nose: No  congestion or rhinorrhea.     Mouth/Throat:     Mouth: Mucous membranes are moist.     Pharynx: Oropharynx is clear. Uvula midline. Posterior oropharyngeal erythema present.     Tonsils: No tonsillar exudate or tonsillar abscesses.  Eyes:     Conjunctiva/sclera: Conjunctivae normal.     Pupils: Pupils are equal, round, and reactive to light.  Cardiovascular:     Rate and Rhythm: Normal rate and regular rhythm.     Heart sounds: Normal heart sounds.  Pulmonary:     Effort: Pulmonary effort is normal.     Breath sounds: Normal breath sounds.  Musculoskeletal:     Cervical back: Normal range of motion and neck supple.  Lymphadenopathy:     Cervical: No cervical adenopathy.  Skin:    General: Skin is warm and dry.  Neurological:     General: No focal deficit present.     Mental Status: She is alert and oriented to person, place, and time.  Psychiatric:        Mood and Affect: Mood normal.        Behavior: Behavior normal.      UC Treatments / Results  Labs (all labs ordered are listed, but only abnormal results are displayed) Labs Reviewed  POCT RAPID STREP A (OFFICE) - Abnormal; Notable for the following components:      Result Value   Rapid Strep A Screen Positive (*)    All other components within normal limits    EKG   Radiology No results found.  Procedures Procedures (including critical care time)  Medications Ordered in UC Medications - No data to display  Initial Impression / Assessment and Plan / UC Course  I have reviewed the triage vital signs and the nursing notes.  Pertinent labs & imaging results that were available during my care of the patient were reviewed by me and considered in my medical decision making (see chart for details).     Positive rapid strep, start amoxicillin Declined COVID testing Prophylactic Tamiflu given  known influenza exposure Lidocaine gargle as needed Salt water gargles and warm liquids OTC analgesics as needed Rest  and fluids PCP follow-up 2 to 3 days for recheck ER precautions reviewed and patient verbalized understanding Final Clinical Impressions(s) / UC Diagnoses   Final diagnoses:  Sore throat  Exposure to influenza  Streptococcal sore throat     Discharge Instructions      Amoxicillin twice daily for 10 days Tamiflu daily for for 7 days given your exposure to flu by your daughter Lidocaine gargle as needed.  Please gargle and spit do not swallow.  You may also water this down with a little bit of water if it is too thick Over-the-counter Tylenol or ibuprofen as needed Rest and fluids Clinical contact you with the result of your COVID testing is positive Follow-up with your PCP 2 to 3 days for recheck Please go to the emergency room if you have any worsening symptoms     ED Prescriptions     Medication Sig Dispense Auth. Provider   oseltamivir (TAMIFLU) 75 MG capsule Take 1 capsule (75 mg total) by mouth daily for 7 days. 7 capsule Melynda Ripple, NP   amoxicillin (AMOXIL) 500 MG capsule Take 1 capsule (500 mg total) by mouth 2 (two) times daily for 10 days. 20 capsule Melynda Ripple, NP   lidocaine (XYLOCAINE) 2 % solution Use as directed 15 mLs in the mouth or throat every 6 (six) hours as needed (Throat pain). Gargle and spit do not swallow 100 mL Melynda Ripple, NP      PDMP not reviewed this encounter.   Melynda Ripple, NP 10/10/22 1421    Melynda Ripple, NP 10/10/22 1425

## 2022-10-10 NOTE — ED Notes (Signed)
Patient did not want covid test completed.

## 2022-10-13 ENCOUNTER — Emergency Department (HOSPITAL_BASED_OUTPATIENT_CLINIC_OR_DEPARTMENT_OTHER)
Admission: EM | Admit: 2022-10-13 | Discharge: 2022-10-13 | Disposition: A | Discharge status: Home / Self Care | Payer: Medicaid Other | Attending: Emergency Medicine | Admitting: Emergency Medicine

## 2022-10-13 ENCOUNTER — Other Ambulatory Visit: Payer: Self-pay

## 2022-10-13 ENCOUNTER — Ambulatory Visit
Admission: EM | Admit: 2022-10-13 | Discharge: 2022-10-13 | Disposition: A | Discharge status: Home / Self Care | Payer: Medicaid Other | Attending: Urgent Care | Admitting: Urgent Care

## 2022-10-13 ENCOUNTER — Emergency Department (HOSPITAL_BASED_OUTPATIENT_CLINIC_OR_DEPARTMENT_OTHER): Payer: Medicaid Other

## 2022-10-13 DIAGNOSIS — Z1152 Encounter for screening for COVID-19: Secondary | ICD-10-CM | POA: Diagnosis not present

## 2022-10-13 DIAGNOSIS — R350 Frequency of micturition: Secondary | ICD-10-CM | POA: Diagnosis not present

## 2022-10-13 DIAGNOSIS — R519 Headache, unspecified: Secondary | ICD-10-CM | POA: Insufficient documentation

## 2022-10-13 DIAGNOSIS — B3731 Acute candidiasis of vulva and vagina: Secondary | ICD-10-CM | POA: Diagnosis not present

## 2022-10-13 DIAGNOSIS — M25562 Pain in left knee: Secondary | ICD-10-CM | POA: Diagnosis not present

## 2022-10-13 LAB — POCT URINALYSIS DIP (MANUAL ENTRY)
Bilirubin, UA: NEGATIVE
Blood, UA: NEGATIVE
Glucose, UA: NEGATIVE mg/dL
Ketones, POC UA: NEGATIVE mg/dL
Leukocytes, UA: NEGATIVE
Nitrite, UA: NEGATIVE
Protein Ur, POC: 30 mg/dL — AB
Spec Grav, UA: 1.025 (ref 1.010–1.025)
Urobilinogen, UA: 0.2 E.U./dL
pH, UA: 7.5 (ref 5.0–8.0)

## 2022-10-13 LAB — RESP PANEL BY RT-PCR (RSV, FLU A&B, COVID)  RVPGX2
Influenza A by PCR: NEGATIVE
Influenza B by PCR: NEGATIVE
Resp Syncytial Virus by PCR: NEGATIVE
SARS Coronavirus 2 by RT PCR: NEGATIVE

## 2022-10-13 LAB — POCT URINE PREGNANCY: Preg Test, Ur: NEGATIVE

## 2022-10-13 MED ORDER — FLUCONAZOLE 150 MG PO TABS: 150.0000 mg | ORAL_TABLET | ORAL | 0 refills | Status: DC

## 2022-10-13 NOTE — Discharge Instructions (Signed)
Please use Tylenol or ibuprofen for pain.  You may use 600 mg ibuprofen every 6 hours or 1000 mg of Tylenol every 6 hours.  You may choose to alternate between the 2.  This would be most effective.  Not to exceed 4 g of Tylenol within 24 hours.  Not to exceed 3200 mg ibuprofen 24 hours.  Recommend drinking plenty of fluids, and completing your course of treatment for your antibiotics as well as yeast infection, if you are having significant worsening head pain, more specific neurologic changes such as numbness, weakness, double vision, you notice significant swelling of your left knee recommend that you return for further evaluation otherwise please follow-up with your primary care doctor.

## 2022-10-13 NOTE — ED Provider Notes (Signed)
[facility]  Note:  This document was prepared using Systems analyst and may include unintentional dictation errors.  MRN: [id number] DOB: 01-27-2001  Subjective:   Sheri Nunez is a 22 y.o. female presenting for 2-day history of acute onset dysuria, urinary frequency, vaginal discharge.  Patient just started taking an antibiotic of amoxicillin for strep.  Tries to hydrate well.  Drinks fruit juice daily.  Also has coffee.  Plans on setting up an appointment with an OB/GYN for consultation regarding frequent BV and yeast infections.  Would like STI testing just to make sure.  No current facility-administered medications for this encounter.  Current Outpatient Medications:    amoxicillin (AMOXIL) 500 MG capsule, Take 1 capsule (500 mg total) by mouth 2 (two) times daily for 10 days., Disp: 20 capsule, Rfl: 0   fluticasone (FLONASE) 50 MCG/ACT nasal spray, Place 2 sprays into both nostrils daily., Disp: 16 g, Rfl: 0   lidocaine (XYLOCAINE) 2 % solution, Use as directed 15 mLs in the mouth or throat every 6 (six) hours as needed (Throat pain). Gargle and spit do not swallow, Disp: 100 mL, Rfl: 0   oseltamivir (TAMIFLU) 75 MG capsule, Take 1 capsule (75 mg total) by mouth daily for 7 days., Disp: 7 capsule, Rfl: 0   No Known Allergies  Past Medical History:  Diagnosis Date   UTI (urinary tract infection)      Past Surgical History:  Procedure Laterality Date   NO PAST SURGERIES      Family History  Problem Relation Age of Onset   Healthy Mother    Healthy Father     Social History   Tobacco Use   Smoking status: Never   Smokeless tobacco: Never  Vaping Use   Vaping Use: Never used  Substance Use Topics   Alcohol use: Not Currently   Drug use: No    ROS   Objective:   Vitals: BP 133/80 (BP Location: Left Arm)   Pulse 76   Temp 99 F (37.2 C) (Oral)   Resp 16   LMP 09/29/2022 (Approximate)   SpO2 97%   Physical  Exam Constitutional:      General: She is not in acute distress.    Appearance: Normal appearance. She is well-developed. She is not ill-appearing, toxic-appearing or diaphoretic.  HENT:     Head: Normocephalic and atraumatic.     Nose: Nose normal.     Mouth/Throat:     Mouth: Mucous membranes are moist.  Eyes:     General: No scleral icterus.       Right eye: No discharge.        Left eye: No discharge.     Extraocular Movements: Extraocular movements intact.     Conjunctiva/sclera: Conjunctivae normal.  Cardiovascular:     Rate and Rhythm: Normal rate.  Pulmonary:     Effort: Pulmonary effort is normal.  Abdominal:     General: Bowel sounds are normal. There is no distension.     Palpations: Abdomen is soft. There is no mass.     Tenderness: There is no abdominal tenderness. There is no right CVA tenderness, left CVA tenderness, guarding or rebound.  Skin:    General: Skin is warm and dry.  Neurological:     General: No focal deficit present.     Mental Status: She is alert and oriented to person, place, and time.  Psychiatric:        Mood and Affect:  Mood normal.        Behavior: Behavior normal.        Thought Content: Thought content normal.        Judgment: Judgment normal.     Results for orders placed or performed during the hospital encounter of 10/13/22 (from the past 24 hour(s))  POCT urine pregnancy     Status: None   Collection Time: 10/13/22 11:34 AM  Result Value Ref Range   Preg Test, Ur Negative Negative  POCT urinalysis dipstick     Status: Abnormal   Collection Time: 10/13/22 11:34 AM  Result Value Ref Range   Color, UA yellow yellow   Clarity, UA clear clear   Glucose, UA negative negative mg/dL   Bilirubin, UA negative negative   Ketones, POC UA negative negative mg/dL   Spec Grav, UA 1.025 1.010 - 1.025   Blood, UA negative negative   pH, UA 7.5 5.0 - 8.0   Protein Ur, POC =30 (A) negative mg/dL   Urobilinogen, UA 0.2 0.2 or 1.0 E.U./dL    Nitrite, UA Negative Negative   Leukocytes, UA Negative Negative    Assessment and Plan :   PDMP not reviewed this encounter.  1. Yeast vaginitis   2. Urinary frequency     Will treat empirically for yeast vaginitis given her history and also current antibiotic use.  Recommended avoiding fruit juices, hydrating very consistently.  Continue with plan to follow-up with an OB/GYN.  Otherwise, will treat based off of results. Counseled patient on potential for adverse effects with medications prescribed/recommended today, ER and return-to-clinic precautions discussed, patient verbalized understanding.    Jaynee Eagles, [place] 10/13/22 1158

## 2022-10-13 NOTE — ED Triage Notes (Signed)
Pt reports "pressure" in her head for a few days.  States she has body aches.  "The same feeling I am having in my head I have in my body too"

## 2022-10-13 NOTE — ED Triage Notes (Addendum)
Pt c/o dysuria, vaginal d/c x 2 days-NAD-steady gait

## 2022-10-13 NOTE — Discharge Instructions (Signed)
Please start fluconazole for yeast infection. Make sure you hydrate very well with plain water and a quantity of 64-80 ounces of water a day.  Please limit drinks that are considered urinary irritants such as soda, sweet tea, coffee, energy drinks, alcohol.  These can worsen your urinary and genital symptoms but also be the source of them.  I will let you know about your urine culture and vaginal swab results through MyChart to see if we need to prescribe or change your antibiotics based off of those results.

## 2022-10-13 NOTE — ED Notes (Signed)
Discharge paperwork reviewed entirely with patient, including Rx's and follow up care. Pain was under control. Pt verbalized understanding as well as all parties involved. No questions or concerns voiced at the time of discharge. No acute distress noted.   Pt ambulated out to PVA without incident or assistance.  

## 2022-10-13 NOTE — ED Provider Notes (Signed)
[facility] Provider Note   CSN: [id number] Arrival date & time: 10/13/22  1510     History  Chief Complaint  Patient presents with   Headache    Sheri Nunez is a 22 y.o. female with overall noncontributory past medical history who presents with concern for "pressure" in her head for the last several days, she reports that headache is worse on the right compared to the left, she denies any nausea, vomiting, photophobia, she reports some occasional feeling that her vision is slightly more unfocused.  She also reports some vague left knee discomfort.  She reports that she has not had any injury.  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  She feels that she has some generalized bodyaches.  She denies any cough, sore throat, congestion, fever, chills.  She does have a temperature of 99.3 in triage.  She was just seen evaluated urgent care for dysuria, urinary frequency and diagnosed with yeast infection, she is also being treated for strep with antibiotics.   Headache      Home Medications Prior to Admission medications   Medication Sig Start Date End Date Taking? Authorizing Provider  amoxicillin (AMOXIL) 500 MG capsule Take 1 capsule (500 mg total) by mouth 2 (two) times daily for 10 days. 10/10/22 10/20/22  Melynda Ripple, NP  fluconazole (DIFLUCAN) 150 MG tablet Take 1 tablet (150 mg total) by mouth every 3 (three) days. 10/13/22   Jaynee Eagles, PA-C  fluticasone (FLONASE) 50 MCG/ACT nasal spray Place 2 sprays into both nostrils daily. 09/20/22   Melynda Ripple, MD  lidocaine (XYLOCAINE) 2 % solution Use as directed 15 mLs in the mouth or throat every 6 (six) hours as needed (Throat pain). Gargle and spit do not swallow 10/10/22   Melynda Ripple, NP  oseltamivir (TAMIFLU) 75 MG capsule Take 1 capsule (75 mg total) by mouth daily for 7 days. 10/10/22  10/17/22  Melynda Ripple, NP      Allergies    Patient has no known allergies.    Review of Systems   Review of Systems  Neurological:  Positive for headaches.  All other systems reviewed and are negative.   Physical Exam Updated Vital Signs BP 112/75 (BP Location: Right Arm)   Pulse 83   Temp 99.3 F (37.4 C) (Oral)   Resp 20   LMP 09/29/2022 (Approximate)   SpO2 100%  Physical Exam Vitals and nursing note reviewed.  Constitutional:      General: She is not in acute distress.    Appearance: Normal appearance.  HENT:     Head: Normocephalic and atraumatic.  Eyes:     General:        Right eye: No discharge.        Left eye: No discharge.  Cardiovascular:     Rate and Rhythm: Normal rate and regular rhythm.  Pulmonary:     Effort: Pulmonary effort is normal. No respiratory distress.  Musculoskeletal:        General: No deformity.     Comments: Patient with no significant soft tissue swelling, abnormal range of motion, varus, valgus, or anterior, posterior drawer laxity of left knee.  She has some mild crepitus of the patella that does not affect her strength, range of motion.  She can ambulate without difficulty.  Skin:    General: Skin  is warm and dry.  Neurological:     Mental Status: She is alert and oriented to person, place, and time.     Comments: Moves all 4 limbs spontaneously, CN II through XII grossly intact, can ambulate without difficulty, intact sensation throughout.   Psychiatric:        Mood and Affect: Mood normal.        Behavior: Behavior normal.     ED Results / Procedures / Treatments   Labs (all labs ordered are listed, but only abnormal results are displayed) Labs Reviewed  RESP PANEL BY RT-PCR (RSV, FLU A&B, COVID)  [id number]    EKG None  Radiology DG Knee Complete 4 Views Left  Result Date: 10/13/2022 CLINICAL DATA:  Left knee pain. EXAM: LEFT KNEE - COMPLETE 4+ VIEW COMPARISON:  None Available. FINDINGS: The mineralization and  alignment are normal. There is no evidence of acute fracture or dislocation. The joint spaces are preserved. No joint effusion or focal soft tissue abnormality identified. IMPRESSION: No acute osseous findings or significant arthropathic changes. Electronically Signed   By: Richardean Sale M.D.   On: 10/13/2022 17:43    Procedures Procedures    Medications Ordered in ED Medications - No data to display  ED Course/ Medical Decision Making/ A&P                             Medical Decision Making Amount and/or Complexity of Data Reviewed Radiology: ordered.   This patient is a 22 y.o. female who presents to the ED for concern of headache which she reports has pressure, generalized bodyaches, as well as some left knee pain without injury.   Differential diagnoses prior to evaluation: Emergent considerations for headache include subarachnoid hemorrhage, meningitis, temporal arteritis, glaucoma, cerebral ischemia, carotid/vertebral dissection, intracranial tumor, Venous sinus thrombosis, carbon monoxide poisoning, acute or chronic subdural hemorrhage.  Other considerations include: Migraine, Cluster headache, Hypertension, Caffeine, alcohol, or drug withdrawal, Pseudotumor cerebri, Arteriovenous malformation, Head injury, Neurocysticercosis, Post-lumbar puncture, Preeclampsia, Tension headache, Sinusitis, Cervical arthritis, Refractive error causing strain, Dental abscess, Otitis media, Temporomandibular joint syndrome, Depression, Somatoform disorder (eg, somatization) Trigeminal neuralgia, Glossopharyngeal neuralgia.  Considered acute fracture, dislocation, versus arthritis or other degenerative related changes of the left knee versus simple sprain, strain.  Past Medical History / Social History / Additional history: Chart reviewed. Pertinent results include: Reviewed patient's recent workup at urgent care, notably she is currently being treated for strep pharyngitis, as well as yeast infection,  and she is being seen evaluated OB/GYN for frequent BV and and yeast infections.  Physical Exam: Physical exam performed. The pertinent findings include: Patient with benign neurologic exam, and vague description of her headache, discussed that it is difficult to say based on her description, but I overall suspect tension type headaches that may be related to her current strep pharyngitis infection, antibiotic use, encourage plenty of rehydration, discussed extensive return precautions for any true neurologic deficits.  On exam patient's left knee with very mild crepitus of the patella but otherwise no other evidence of acute fracture, dislocation, joint effusion, or collateral ligament injury.  Medications / Treatment: Encouraged ibuprofen, Tylenol, plenty of fluids, rest   Disposition: After consideration of the diagnostic results and the patients response to treatment, I feel that patient has some nondescript symptoms which seem consistent with tension type headache, and nontraumatic knee injury with some mild knee crepitus.  Overall I have low clinical suspicion for any acute intracranial  injury, CVA, subarachnoid hemorrhage, AVM, DVT, patient is PERC negative for PE.   emergency department workup does not suggest an emergent condition requiring admission or immediate intervention beyond what has been performed at this time. The plan is: as above. The patient is safe for discharge and has been instructed to return immediately for worsening symptoms, change in symptoms or any other concerns.  Final Clinical Impression(s) / ED Diagnoses Final diagnoses:  Acute nonintractable headache, unspecified headache type  Acute pain of left knee    Rx / DC Orders ED Discharge Orders     None         Dorien Chihuahua 10/13/22 Duanne Limerick, MD 10/13/22 [phone]

## 2022-10-14 LAB — CERVICOVAGINAL ANCILLARY ONLY
Bacterial Vaginitis (gardnerella): NEGATIVE
Candida Glabrata: NEGATIVE
Candida Vaginitis: NEGATIVE
Chlamydia: NEGATIVE
Comment: NEGATIVE
Comment: NEGATIVE
Comment: NEGATIVE
Comment: NEGATIVE
Comment: NEGATIVE
Comment: NORMAL
Neisseria Gonorrhea: NEGATIVE
Trichomonas: NEGATIVE

## 2022-10-14 LAB — URINE CULTURE: Culture: <10000 — AB

## 2022-10-16 ENCOUNTER — Ambulatory Visit (INDEPENDENT_AMBULATORY_CARE_PROVIDER_SITE_OTHER): Payer: Medicaid Other | Admitting: Family Medicine

## 2022-10-16 VITALS — BP 127/79 | HR 94 | Ht 68.0 in | Wt 203.8 lb

## 2022-10-16 DIAGNOSIS — R300 Dysuria: Secondary | ICD-10-CM | POA: Diagnosis present

## 2022-10-16 DIAGNOSIS — R102 Pelvic and perineal pain: Secondary | ICD-10-CM | POA: Diagnosis not present

## 2022-10-16 MED ORDER — PHENAZOPYRIDINE HCL 200 MG PO TABS: 200.0000 mg | ORAL_TABLET | Freq: Three times a day (TID) | ORAL | 0 refills | Status: AC | PRN

## 2022-10-16 NOTE — Patient Instructions (Signed)
I would recommend going ahead and taking the Diflucan pill that you are prescribed previously and see if that helps her symptoms.  If in the next 3 days it does not help your symptoms, then I would recommend starting the medication I am going to send in which is called Pyridium.  This is a medication that helps treat inflammation in the bladder.  I would also recommend starting something like Azo's which is over-the-counter or drinking cranberry juice to help.

## 2022-10-16 NOTE — Progress Notes (Signed)
    SUBJECTIVE:   CHIEF COMPLAINT / HPI:   Pelvic discomfort with dysuria - Dysuria been happening for the last 2 weeks - Was evaluated in an urgent care and had STD testing that was negative as well as negative urine culture - Still having burning with sensation with urination and mild increase in frequency - Has not had any further unprotected intercourse since the STD testing - LMP started on the 15th - Had an episode of lower abdomen discomfort that was a sharp pain that lasted for <1 hour  PERTINENT  PMH / PSH: Reviewed  OBJECTIVE:   BP 127/79   Pulse 94   Ht 5\' 8"  (1.727 m)   Wt 203 lb 12.8 oz (92.4 kg)   LMP 09/29/2022 (Approximate)   SpO2 99%   BMI 30.99 kg/m   General: NAD, well-appearing, well-nourished Respiratory: No respiratory distress, breathing comfortably, able to speak in full sentences Skin: warm and dry, no rashes noted on exposed skin Psych: Appropriate affect and mood Pelvic exam: VULVA: normal appearing vulva with no masses, tenderness or lesions, VAGINA: vaginal discharge - white and curd-like, CERVIX: normal appearing cervix without discharge or lesions, exam chaperoned by Salvatore Marvel, CMA.  ASSESSMENT/PLAN:   Dysuria Given the persistent symptoms and negative UA and urine culture recently collected, will not recollect today as unlikely to be a UTI. Prior STD testing was negative. On pelvic exam, does appear to have some yeast, which could contribute to the current symptoms. Do wonder if it is irritation from yeast or if this is an acute cystitis presentation. - Recommended patient pick up the prior diflucan prescription - If not improving in 3 days, start the Pyridium TID x2 days - Follow-up in 1 week if persistent symptoms  Pelvic Pain Episode of pelvic pain that was described seems most consistent with mittelschmerz pain, do not feel like there is a significant ovarian or pelvic etiology at this time. - Continue to closely monitor for  recurrence   Rise Patience, [place]

## 2022-10-27 ENCOUNTER — Encounter: Payer: Medicaid Other | Admitting: Obstetrics & Gynecology

## 2022-11-20 ENCOUNTER — Ambulatory Visit: Payer: Medicaid Other | Admitting: Family Medicine

## 2022-11-23 ENCOUNTER — Ambulatory Visit: Payer: Medicaid Other

## 2022-11-24 ENCOUNTER — Ambulatory Visit
Admission: EM | Admit: 2022-11-24 | Discharge: 2022-11-24 | Disposition: A | Discharge status: Home / Self Care | Payer: Medicaid Other | Attending: Urgent Care | Admitting: Urgent Care

## 2022-11-24 DIAGNOSIS — R070 Pain in throat: Secondary | ICD-10-CM | POA: Diagnosis present

## 2022-11-24 DIAGNOSIS — Z1152 Encounter for screening for COVID-19: Secondary | ICD-10-CM | POA: Insufficient documentation

## 2022-11-24 DIAGNOSIS — J029 Acute pharyngitis, unspecified: Secondary | ICD-10-CM | POA: Insufficient documentation

## 2022-11-24 LAB — POCT RAPID STREP A (OFFICE): Rapid Strep A Screen: NEGATIVE

## 2022-11-24 MED ORDER — CHLORHEXIDINE GLUCONATE 0.12 % MT SOLN: OROMUCOSAL | 0 refills | Status: DC

## 2022-11-24 NOTE — ED Triage Notes (Signed)
Pt c/o sore throat and "tongue tingling" x 2-3 days-NAD-steady gait

## 2022-11-24 NOTE — ED Provider Notes (Signed)
[facility]  Note:  This document was prepared using Systems analyst and may include unintentional dictation errors.  MRN: [id number] DOB: 2001-03-25  Subjective:   Sheri Nunez is a 22 y.o. female presenting for 2 to 3-day history of acute onset irritation of her tongue, throat pain.  Patient would like to have all the testing we can do including strep, oral cytology.  No fever, runny or stuffy nose, cough.  No vesicular lesions.  No current facility-administered medications for this encounter.  Current Outpatient Medications:    fluconazole (DIFLUCAN) 150 MG tablet, Take 1 tablet (150 mg total) by mouth every 3 (three) days., Disp: 3 tablet, Rfl: 0   fluticasone (FLONASE) 50 MCG/ACT nasal spray, Place 2 sprays into both nostrils daily., Disp: 16 g, Rfl: 0   lidocaine (XYLOCAINE) 2 % solution, Use as directed 15 mLs in the mouth or throat every 6 (six) hours as needed (Throat pain). Gargle and spit do not swallow, Disp: 100 mL, Rfl: 0   No Known Allergies  Past Medical History:  Diagnosis Date   UTI (urinary tract infection)      Past Surgical History:  Procedure Laterality Date   NO PAST SURGERIES      Family History  Problem Relation Age of Onset   Healthy Mother    Healthy Father     Social History   Tobacco Use   Smoking status: Never   Smokeless tobacco: Never  Vaping Use   Vaping Use: Never used  Substance Use Topics   Alcohol use: Not Currently   Drug use: No    ROS   Objective:   Vitals: BP 127/78 (BP Location: Right Arm)   Pulse 85   Temp 99.1 F (37.3 C) (Oral)   Resp 18   LMP 11/23/2022   SpO2 98%   Physical Exam Constitutional:      General: She is not in acute distress.    Appearance: Normal appearance. She is well-developed. She is not ill-appearing, toxic-appearing or diaphoretic.  HENT:     Head: Normocephalic and atraumatic.     Nose: Nose normal.     Mouth/Throat:     Mouth: Mucous  membranes are moist. No injury, lacerations, oral lesions or angioedema.     Dentition: Normal dentition. Does not have dentures. No dental tenderness, gingival swelling, dental caries, dental abscesses or gum lesions.     Tongue: No lesions. Tongue does not deviate from midline.     Palate: No mass and lesions.     Pharynx: No pharyngeal swelling, oropharyngeal exudate, posterior oropharyngeal erythema or uvula swelling.     Tonsils: No tonsillar exudate or tonsillar abscesses. 0 on the right. 0 on the left.  Eyes:     General: No scleral icterus.       Right eye: No discharge.        Left eye: No discharge.     Extraocular Movements: Extraocular movements intact.  Cardiovascular:     Rate and Rhythm: Normal rate.  Pulmonary:     Effort: Pulmonary effort is normal.  Skin:    General: Skin is warm and dry.  Neurological:     General: No focal deficit present.     Mental Status: She is alert and oriented to person, place, and time.  Psychiatric:        Mood and Affect: Mood normal.        Behavior: Behavior normal.     Results for  orders placed or performed during the hospital encounter of 11/24/22 (from the past 24 hour(s))  POCT rapid strep A     Status: None   Collection Time: 11/24/22  7:43 PM  Result Value Ref Range   Rapid Strep A Screen Negative Negative    Assessment and Plan :   PDMP not reviewed this encounter.  1. Viral pharyngitis   2. Throat pain     Testing pending.  Recommended conservative management, offered chlorhexidine rinse. Counseled patient on potential for adverse effects with medications prescribed/recommended today, ER and return-to-clinic precautions discussed, patient verbalized understanding.    Jaynee Eagles, [place] 11/27/22 [phone]

## 2022-11-25 LAB — CYTOLOGY, (ORAL, ANAL, URETHRAL) ANCILLARY ONLY
Chlamydia: NEGATIVE
Comment: NEGATIVE
Comment: NEGATIVE
Comment: NORMAL
Neisseria Gonorrhea: NEGATIVE
Trichomonas: NEGATIVE

## 2022-11-25 LAB — SARS CORONAVIRUS 2 (TAT 6-24 HRS): SARS Coronavirus 2: NEGATIVE

## 2022-11-26 LAB — CULTURE, GROUP A STREP (THRC)

## 2022-11-27 ENCOUNTER — Telehealth (HOSPITAL_COMMUNITY): Payer: Self-pay | Admitting: Emergency Medicine

## 2022-11-27 MED ORDER — AMOXICILLIN 500 MG PO CAPS: 500.0000 mg | ORAL_CAPSULE | Freq: Two times a day (BID) | ORAL | 0 refills | Status: DC

## 2022-11-30 ENCOUNTER — Ambulatory Visit: Payer: Medicaid Other | Admitting: Nurse Practitioner

## 2022-11-30 ENCOUNTER — Encounter: Payer: Self-pay | Admitting: Nurse Practitioner

## 2022-11-30 VITALS — BP 124/62 | HR 75 | Ht 68.0 in | Wt 201.0 lb

## 2022-11-30 DIAGNOSIS — N898 Other specified noninflammatory disorders of vagina: Secondary | ICD-10-CM | POA: Diagnosis not present

## 2022-11-30 DIAGNOSIS — Z113 Encounter for screening for infections with a predominantly sexual mode of transmission: Secondary | ICD-10-CM | POA: Diagnosis not present

## 2022-11-30 LAB — WET PREP FOR TRICH, YEAST, CLUE

## 2022-11-30 NOTE — Progress Notes (Signed)
   Acute Office Visit  Subjective:    Patient ID: Sheri Nunez, female    DOB: 09-Nov-2000, 22 y.o.   MRN: [id number]   HPI 22 y.o. G3P1001 presents as new patient for vaginal odor and discharge. Symptoms were worse last week but have improved some. Felt tingling on vulvar. No history of HSV. Reports recurrent yeast and BV infections. + yeast and BV 12/11/20, + yeast 08/14/2021. Negative vaginitis panels 05/13/22, 07/30/22, 09/24/2022, 09/26/22 and 10/13/22. Has used boric acid in the past. Seemed to work for a while but does not feel like it does now. Sexually active with one partner. Would like STD screening today.    Review of Systems  Constitutional: Negative.   Genitourinary:  Positive for vaginal discharge.       Vaginal odor       Objective:    Physical Exam Constitutional:      Appearance: Normal appearance.  Genitourinary:    General: Normal vulva.     Cervix: Normal.     BP 124/62   Pulse 75   Ht 5\' 8"  (1.727 m) Comment: patient reproted due to hair.  Wt 201 lb (91.2 kg)   LMP 11/22/2022   SpO2 100%   BMI 30.56 kg/m  Wt Readings from Last 3 Encounters:  11/30/22 201 lb (91.2 kg)  10/16/22 203 lb 12.8 oz (92.4 kg)  05/13/22 170 lb (77.1 kg)        Patient informed chaperone available to be present for breast and/or pelvic exam. Patient has requested no chaperone to be present. Patient has been advised what will be completed during breast and pelvic exam.   Wet prep negative for pathogens  Assessment & Plan:   Problem List Items Addressed This Visit       Other   Vaginal discharge - Primary   Relevant Orders   WET PREP FOR [place], [place]   Other Visit Diagnoses     Screening examination for STD (sexually transmitted disease)       Relevant Orders   C. trachomatis/N. gonorrhoeae RNA   RPR   HIV Antibody (routine testing w rflx)      Plan: Negative wet prep and exam. STD panel pending. May restart boric acid suppositories twice weekly, start  women's probiotic, avoid soaps/body washes with harsh chemicals. Discussed changes in discharge throughout menstrual cycle.      Tamela Gammon DNP, 2:05 PM 11/30/2022

## 2022-12-01 LAB — C. TRACHOMATIS/N. GONORRHOEAE RNA
C. trachomatis RNA, TMA: NOT DETECTED
N. gonorrhoeae RNA, TMA: NOT DETECTED

## 2022-12-01 LAB — HIV ANTIBODY (ROUTINE TESTING W REFLEX): HIV 1&2 Ab, 4th Generation: NONREACTIVE

## 2022-12-01 LAB — RPR: RPR Ser Ql: NONREACTIVE

## 2022-12-23 ENCOUNTER — Ambulatory Visit
Admission: EM | Admit: 2022-12-23 | Discharge: 2022-12-23 | Disposition: A | Discharge status: Home / Self Care | Payer: Medicaid Other | Attending: Nurse Practitioner | Admitting: Nurse Practitioner

## 2022-12-23 DIAGNOSIS — J029 Acute pharyngitis, unspecified: Secondary | ICD-10-CM | POA: Insufficient documentation

## 2022-12-23 LAB — POCT RAPID STREP A (OFFICE): Rapid Strep A Screen: NEGATIVE

## 2022-12-23 MED ORDER — AZITHROMYCIN 250 MG PO TABS: 250.0000 mg | ORAL_TABLET | Freq: Every day | ORAL | 0 refills | Status: DC

## 2022-12-23 NOTE — Discharge Instructions (Signed)
The clinic will contact you with results of the strep culture done today if positive.  Given your persistent symptoms start Zithromax as prescribed Salt gargles and warm liquids Over-the-counter Tylenol or ibuprofen as needed Follow-up with your PCP if your symptoms do not improve Please go to the ER for any worsening symptoms

## 2022-12-23 NOTE — ED Provider Notes (Signed)
[facility]    CSN: [id number] Arrival date & time: 12/23/22  0948      History   Chief Complaint Chief Complaint  Patient presents with   Sore Throat    HPI Sheri Nunez is a 22 y.o. female  presents for evaluation of URI symptoms for 2 days. Patient reports associated symptoms of sore throat. Denies N/V/D, cough, congestion, fevers, ear pain, body aches, shortness of breath. Patient does not have a hx of asthma or smoking. No known sick contacts.  Pt has taken nothing OTC for symptoms.  Patient was seen in urgent care on 3/12 for sore throat.  She had a negative rapid strep but throat culture did show small amount of strep A.  She was placed on amoxicillin for 10 days.  She states her symptoms improved but did not completely resolved and has remained unchanged since that time.  Pt has no other concerns at this time.    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 Authorizing Provider  azithromycin (ZITHROMAX) 250 MG tablet Take 1 tablet (250 mg total) by mouth daily. Take first 2 tablets together, then 1 every day until finished. 12/23/22  Yes Radford PaxMayer, Jodi R, NP   fluticasone (FLONASE) 50 MCG/ACT nasal spray Place 2 sprays into both nostrils daily. 09/20/22   Domenick GongMortenson, Ashley, MD  lidocaine (XYLOCAINE) 2 % solution Use as directed 15 mLs in the mouth or throat every 6 (six) hours as needed (Throat pain). Gargle and spit do not swallow 10/10/22   Radford PaxMayer, Jodi R, NP    Family History Family History  Problem Relation Age of Onset   Healthy Mother    Healthy Father     Social History Social History   Tobacco Use   Smoking status: Never   Smokeless tobacco: Never  Vaping Use   Vaping Use: Never used  Substance Use Topics   Alcohol use: Not Currently   Drug use: No     Allergies   Patient has no known allergies.   Review of Systems Review of Systems  HENT:  Positive for sore throat.      Physical Exam Triage Vital Signs ED Triage Vitals  Enc Vitals Group     BP 12/23/22 1029 102/63     Pulse Rate 12/23/22 1025 83     Resp 12/23/22 1025 19  Temp 12/23/22 1025 98.3 F (36.8 C)     Temp Source 12/23/22 1025 Oral     SpO2 12/23/22 1025 96 %     Weight --      Height --      Head Circumference --      Peak Flow --      Pain Score 12/23/22 1024 3     Pain Loc --      Pain Edu? --      Excl. in GC? --    No data found.  Updated Vital Signs BP 102/63   Pulse 83   Temp 98.3 F (36.8 C) (Oral)   Resp 19   LMP 11/22/2022 (Exact Date)   SpO2 96%   Visual Acuity Right Eye Distance:   Left Eye Distance:   Bilateral Distance:    Right Eye Near:   Left Eye Near:    Bilateral Near:     Physical Exam Vitals and nursing note reviewed.  Constitutional:      General: She is not in acute distress.    Appearance: She is well-developed. She is not ill-appearing.  HENT:     Head: Normocephalic and atraumatic.     Right Ear: Tympanic membrane and ear canal normal.     Left Ear: Tympanic membrane and ear canal normal.     Nose: No congestion or rhinorrhea.     Mouth/Throat:     Mouth: Mucous membranes are moist.      Pharynx: Oropharynx is clear. Uvula midline. Posterior oropharyngeal erythema present.     Tonsils: No tonsillar exudate or tonsillar abscesses.  Eyes:     Conjunctiva/sclera: Conjunctivae normal.     Pupils: Pupils are equal, round, and reactive to light.  Cardiovascular:     Rate and Rhythm: Normal rate and regular rhythm.     Heart sounds: Normal heart sounds.  Pulmonary:     Effort: Pulmonary effort is normal.     Breath sounds: Normal breath sounds.  Musculoskeletal:     Cervical back: Normal range of motion and neck supple.  Lymphadenopathy:     Cervical: No cervical adenopathy.  Skin:    General: Skin is warm and dry.  Neurological:     General: No focal deficit present.     Mental Status: She is alert and oriented to person, place, and time.  Psychiatric:        Mood and Affect: Mood normal.        Behavior: Behavior normal.      UC Treatments / Results  Labs (all labs ordered are listed, but only abnormal results are displayed) Labs Reviewed  CULTURE, GROUP A STREP [facility])  POCT RAPID STREP A (OFFICE)    EKG   Radiology No results found.  Procedures Procedures (including critical care time)  Medications Ordered in UC Medications - No data to display  Initial Impression / Assessment and Plan / UC Course  I have reviewed the triage vital signs and the nursing notes.  Pertinent labs & imaging results that were available during my care of the patient were reviewed by me and considered in my medical decision making (see chart for details).     Negative rapid strep, will culture. Given patient reports her symptoms of her previous strep infection in March did not resolve with treatment will retreat while awaiting throat culture with Zithromax Salt water gargles and warm liquids PCP follow-up if symptoms do not improve ER precautions reviewed and patient verbalized understanding Final  Clinical Impressions(s) / UC Diagnoses   Final diagnoses:  Sore throat   Acute pharyngitis, unspecified etiology     Discharge Instructions      The clinic will contact you with results of the strep culture done today if positive.  Given your persistent symptoms start Zithromax as prescribed Salt gargles and warm liquids Over-the-counter Tylenol or ibuprofen as needed Follow-up with your PCP if your symptoms do not improve Please go to the ER for any worsening symptoms     ED Prescriptions     Medication Sig Dispense Auth. Provider   azithromycin (ZITHROMAX) 250 MG tablet Take 1 tablet (250 mg total) by mouth daily. Take first 2 tablets together, then 1 every day until finished. 6 tablet Radford Pax, NP      PDMP not reviewed this encounter.   Radford Pax, NP 12/23/22 [phone]

## 2022-12-23 NOTE — ED Triage Notes (Signed)
Pt presents with c/o sore throat. States her tonsils enlarged and states the antibiotics she was given did not work.

## 2022-12-26 LAB — CULTURE, GROUP A STREP (THRC)

## 2023-01-26 ENCOUNTER — Ambulatory Visit
Admission: EM | Admit: 2023-01-26 | Discharge: 2023-01-26 | Disposition: A | Discharge status: Home / Self Care | Payer: Medicaid Other | Attending: Nurse Practitioner | Admitting: Nurse Practitioner

## 2023-01-26 DIAGNOSIS — Z113 Encounter for screening for infections with a predominantly sexual mode of transmission: Secondary | ICD-10-CM | POA: Diagnosis present

## 2023-01-26 DIAGNOSIS — N898 Other specified noninflammatory disorders of vagina: Secondary | ICD-10-CM | POA: Diagnosis not present

## 2023-01-26 DIAGNOSIS — R300 Dysuria: Secondary | ICD-10-CM | POA: Insufficient documentation

## 2023-01-26 LAB — POCT URINALYSIS DIP (MANUAL ENTRY)
Bilirubin, UA: NEGATIVE
Blood, UA: NEGATIVE
Glucose, UA: NEGATIVE mg/dL
Ketones, POC UA: NEGATIVE mg/dL
Nitrite, UA: NEGATIVE
Protein Ur, POC: NEGATIVE mg/dL
Spec Grav, UA: >=1.03 — AB (ref 1.010–1.025)
Urobilinogen, UA: 0.2 E.U./dL
pH, UA: 5.5 (ref 5.0–8.0)

## 2023-01-26 LAB — POCT URINE PREGNANCY: Preg Test, Ur: NEGATIVE

## 2023-01-26 MED ORDER — METRONIDAZOLE 500 MG PO TABS
500.0000 mg | ORAL_TABLET | Freq: Two times a day (BID) | ORAL | 0 refills | Status: DC
Start: 2023-01-26 — End: 2023-05-03

## 2023-01-26 NOTE — ED Triage Notes (Signed)
Pt presents with c/o vaginal d/c and swelling x 2 days. States she feel discomfort and has some burning during urination.  No STD exposure. Denies abd pain and vag odor.

## 2023-01-26 NOTE — ED Provider Notes (Signed)
[facility]    CSN: [id number] Arrival date & time: 01/26/23  1212      History   Chief Complaint Chief Complaint  Patient presents with   Vaginal Discharge   Dysuria   Groin Swelling    HPI Sheri Nunez is a 22 y.o. female presents for evaluation of vaginal discharge.  Patient reports yesterday she developed some slightly malodorous vaginal discharge with vaginal irritation.  Denies any fevers, flank pain, nausea/vomiting.  Does have some mild burning with urination but she thinks it is more from the vaginal irritation.  No urgency or frequency or hematuria.  No STD exposure but she would like screening while she is here.  She has a history of BV infections and states these feel similar to previous BV infections.  She has not taken any OTC medications since symptom onset.  Denies any other concerns at this time.   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 Authorizing Provider  metroNIDAZOLE (FLAGYL) 500 MG tablet Take 1 tablet (500 mg total) by mouth 2 (two) times daily.  01/26/23  Yes Radford Pax, NP  azithromycin (ZITHROMAX) 250 MG tablet Take 1 tablet (250 mg total) by mouth daily. Take first 2 tablets together, then 1 every day until finished. 12/23/22   Radford Pax, NP  fluticasone (FLONASE) 50 MCG/ACT nasal spray Place 2 sprays into both nostrils daily. 09/20/22   Domenick Gong, MD  lidocaine (XYLOCAINE) 2 % solution Use as directed 15 mLs in the mouth or throat every 6 (six) hours as needed (Throat pain). Gargle and spit do not swallow 10/10/22   Radford Pax, NP    Family History Family History  Problem Relation Age of Onset   Healthy Mother    Healthy Father     Social History Social History   Tobacco Use   Smoking status: Never   Smokeless tobacco: Never  Vaping Use   Vaping Use: Never used  Substance Use Topics   Alcohol use: Not Currently   Drug use: No     Allergies   Patient has no known allergies.   Review of Systems Review of Systems  Genitourinary:  Positive for dysuria and vaginal discharge.     Physical Exam Triage Vital Signs ED Triage Vitals [01/26/23 1225]  Enc Vitals Group     BP (!) 106/58     Pulse Rate 80     Resp 18     Temp 98.1 F (36.7 C)     Temp Source Oral     SpO2 98 %     Weight      Height      Head Circumference      Peak Flow      Pain Score 6     Pain Loc      Pain Edu?      Excl. in GC?    No data found.  Updated Vital Signs BP (!) 106/58 (BP Location: Right Arm)   Pulse 80   Temp 98.1 F (36.7 C) (Oral)   Resp 18   LMP 01/19/2023 (Exact Date)   SpO2 98%   Visual Acuity Right Eye Distance:   Left Eye Distance:   Bilateral Distance:    Right Eye Near:   Left Eye Near:    Bilateral Near:     Physical Exam Vitals and nursing note reviewed.  Constitutional:      Appearance: Normal appearance.  HENT:     Head: Normocephalic and atraumatic.  Eyes:     Pupils: Pupils are equal, round, and reactive to light.  Cardiovascular:     Rate and Rhythm: Normal rate.   Pulmonary:     Effort: Pulmonary effort is normal.  Abdominal:     Tenderness: There is no right CVA tenderness or left CVA tenderness.  Skin:    General: Skin is warm and dry.  Neurological:     General: No focal deficit present.     Mental Status: She is alert and oriented to person, place, and time.  Psychiatric:        Mood and Affect: Mood normal.        Behavior: Behavior normal.      UC Treatments / Results  Labs (all labs ordered are listed, but only abnormal results are displayed) Labs Reviewed  POCT URINALYSIS DIP (MANUAL ENTRY) - Abnormal; Notable for the following components:      Result Value   Spec Grav, UA >=1.030 (*)    Leukocytes, UA Trace (*)    All other components within normal limits  URINE CULTURE  POCT URINE PREGNANCY  CERVICOVAGINAL ANCILLARY ONLY    EKG   Radiology No results found.  Procedures Procedures (including critical care time)  Medications Ordered in UC Medications - No data to display  Initial Impression / Assessment and Plan / UC Course  I have reviewed the triage vital signs and the nursing notes.  Pertinent labs & imaging results that were available during my care of the patient were reviewed by me and considered in my medical decision making (see chart for details).     Reviewed exam and symptoms with patient.  No red flags.  Trace leuks in urine.  Patient denies UTI symptoms.  Will culture Vaginal swab and will contact for any results.   Metronidazole twice daily for 7 days to treat for likely BV infection Rest and fluids PCP follow-up if symptoms do not improve ER precautions reviewed and patient verbalized understanding Final Clinical Impressions(s) / UC Diagnoses   Final diagnoses:  Dysuria  Vaginal discharge  Screening examination for STD (sexually transmitted disease)     Discharge Instructions      Start Flagyl twice daily for 7 days.  The clinic will contact you with results of the testing done  today  if positive Rest and fluids Follow-up with your PCP if your symptoms do not improve Please go to the emergency room if you have any worsening symptoms   ED Prescriptions     Medication Sig Dispense Auth. Provider   metroNIDAZOLE (FLAGYL) 500 MG tablet Take 1 tablet (500 mg total) by mouth 2 (two) times daily. 14 tablet Radford Pax, NP      PDMP not reviewed this encounter.   Radford Pax, NP 01/26/23 1255

## 2023-01-26 NOTE — Discharge Instructions (Signed)
Start Flagyl twice daily for 7 days.  The clinic will contact you with results of the testing done today if positive Rest and fluids Follow-up with your PCP if your symptoms do not improve Please go to the emergency room if you have any worsening symptoms

## 2023-01-27 ENCOUNTER — Telehealth (HOSPITAL_COMMUNITY): Payer: Self-pay | Admitting: Emergency Medicine

## 2023-01-27 LAB — CERVICOVAGINAL ANCILLARY ONLY
Bacterial Vaginitis (gardnerella): NEGATIVE
Candida Glabrata: NEGATIVE
Candida Vaginitis: POSITIVE — AB
Chlamydia: NEGATIVE
Comment: NEGATIVE
Comment: NEGATIVE
Comment: NEGATIVE
Comment: NEGATIVE
Comment: NEGATIVE
Comment: NORMAL
Neisseria Gonorrhea: NEGATIVE
Trichomonas: NEGATIVE

## 2023-01-27 LAB — URINE CULTURE: Culture: NO GROWTH

## 2023-01-27 MED ORDER — FLUCONAZOLE 150 MG PO TABS: 150.0000 mg | ORAL_TABLET | Freq: Once | ORAL | 0 refills | Status: AC

## 2023-02-25 ENCOUNTER — Ambulatory Visit
Admission: EM | Admit: 2023-02-25 | Discharge: 2023-02-25 | Disposition: A | Discharge status: Home / Self Care | Payer: Medicaid Other | Attending: Urgent Care | Admitting: Urgent Care

## 2023-02-25 ENCOUNTER — Ambulatory Visit (INDEPENDENT_AMBULATORY_CARE_PROVIDER_SITE_OTHER): Payer: Medicaid Other

## 2023-02-25 DIAGNOSIS — M25522 Pain in left elbow: Secondary | ICD-10-CM | POA: Diagnosis not present

## 2023-02-25 DIAGNOSIS — R9389 Abnormal findings on diagnostic imaging of other specified body structures: Secondary | ICD-10-CM | POA: Diagnosis not present

## 2023-02-25 DIAGNOSIS — S50812A Abrasion of left forearm, initial encounter: Secondary | ICD-10-CM | POA: Diagnosis not present

## 2023-02-25 DIAGNOSIS — S5002XA Contusion of left elbow, initial encounter: Secondary | ICD-10-CM | POA: Diagnosis not present

## 2023-02-25 MED ORDER — NAPROXEN 500 MG PO TABS: 500.0000 mg | ORAL_TABLET | Freq: Two times a day (BID) | ORAL | 0 refills | Status: DC
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
## 2023-05-03 ENCOUNTER — Ambulatory Visit
Admission: EM | Admit: 2023-05-03 | Discharge: 2023-05-03 | Disposition: A | Discharge status: Home / Self Care | Payer: Medicaid Other | Attending: Internal Medicine | Admitting: Internal Medicine

## 2023-05-03 DIAGNOSIS — N760 Acute vaginitis: Secondary | ICD-10-CM | POA: Diagnosis not present

## 2023-05-03 DIAGNOSIS — Z113 Encounter for screening for infections with a predominantly sexual mode of transmission: Secondary | ICD-10-CM

## 2023-05-03 LAB — POCT URINALYSIS DIP (MANUAL ENTRY)
Bilirubin, UA: NEGATIVE
Blood, UA: NEGATIVE
Glucose, UA: NEGATIVE mg/dL
Ketones, POC UA: NEGATIVE mg/dL
Leukocytes, UA: NEGATIVE
Nitrite, UA: NEGATIVE
Protein Ur, POC: NEGATIVE mg/dL
Spec Grav, UA: 1.025 (ref 1.010–1.025)
Urobilinogen, UA: 0.2 E.U./dL
pH, UA: 6 (ref 5.0–8.0)

## 2023-05-03 LAB — POCT URINE PREGNANCY: Preg Test, Ur: NEGATIVE

## 2023-05-03 MED ORDER — FLUCONAZOLE 150 MG PO TABS
150.0000 mg | ORAL_TABLET | Freq: Every day | ORAL | 0 refills | Status: AC
Start: 2023-05-03 — End: 2023-05-05

## 2023-05-03 MED ORDER — METRONIDAZOLE 500 MG PO TABS
500.0000 mg | ORAL_TABLET | Freq: Two times a day (BID) | ORAL | 0 refills | Status: DC
Start: 2023-05-03 — End: 2023-07-12

## 2023-05-03 NOTE — ED Triage Notes (Signed)
Pt presents with c/o vaginal discharge and odor. States she is prone to BV and yeast infections. She is requesting HIV/STD testing.

## 2023-05-03 NOTE — ED Provider Notes (Signed)
[facility]    CSN: [id number] Arrival date & time: 05/03/23  1057      History   Chief Complaint Chief Complaint  Patient presents with   Vaginal Discharge   vaginal odor    HPI Sheri Nunez is a 22 y.o. female presents for evaluation of vaginal discharge.  Patient reports 2 days of a malodorous and pruritic vaginal discharge.  Denies any dysuria, fevers, nausea/vomiting, flank pain.  No known STD exposure but she would like screening.  Does report she is prone to BV and yeast infections reports her current symptoms are similar to that.  She tried boric acid over-the-counter but states it made it worse.  No other concerns at this time.   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 Authorizing Provider  fluconazole (DIFLUCAN) 150 MG tablet Take 1 tablet (150 mg total) by mouth daily for 2 days. Take 1 tablet today and repeat in 3 days if symptoms persist 05/03/23 05/05/23 Yes Radford Pax, NP  metroNIDAZOLE (FLAGYL) 500 MG tablet Take 1 tablet (500 mg total) by mouth 2 (two) times daily. 05/03/23  Yes Radford Pax, NP  azithromycin (ZITHROMAX) 250 MG tablet  Take 1 tablet (250 mg total) by mouth daily. Take first 2 tablets together, then 1 every day until finished. 12/23/22   Radford Pax, NP  fluticasone (FLONASE) 50 MCG/ACT nasal spray Place 2 sprays into both nostrils daily. 09/20/22   Domenick Gong, MD  lidocaine (XYLOCAINE) 2 % solution Use as directed 15 mLs in the mouth or throat every 6 (six) hours as needed (Throat pain). Gargle and spit do not swallow 10/10/22   Radford Pax, NP  naproxen (NAPROSYN) 500 MG tablet Take 1 tablet (500 mg total) by mouth 2 (two) times daily with a meal. 02/25/23   Wallis Bamberg, PA-C    Family History Family History  Problem Relation Age of Onset   Healthy Mother    Healthy Father     Social History Social History   Tobacco Use   Smoking status: Never   Smokeless tobacco: Never  Vaping Use   Vaping status: Never Used  Substance Use Topics   Alcohol use: Not Currently   Drug use: No     Allergies   Patient has no known allergies.   Review of Systems Review of Systems  Genitourinary:  Positive for vaginal discharge.     Physical Exam Triage Vital Signs ED Triage Vitals  Encounter Vitals Group     BP 05/03/23 1117 126/78     Systolic BP Percentile --      Diastolic BP Percentile --      Pulse Rate 05/03/23 1117 85     Resp 05/03/23 1117 18     Temp 05/03/23 1117 98.3 F (36.8 C)     Temp Source 05/03/23 1117 Oral     SpO2 05/03/23 1117 98 %     Weight --      Height --      Head Circumference --      Peak Flow --      Pain Score 05/03/23 1116 0     Pain Loc --      Pain Education --      Exclude from Growth Chart --    No data found.  Updated Vital Signs BP 126/78 (BP Location: Right Arm)   Pulse 85   Temp 98.3 F (36.8 C) (Oral)   Resp 18   LMP 04/19/2023 (Exact Date)   SpO2 98%   Visual Acuity Right Eye Distance:   Left Eye Distance:   Bilateral Distance:    Right Eye Near:   Left Eye Near:    Bilateral Near:     Physical Exam Vitals and nursing note  reviewed.  Constitutional:      Appearance: Normal appearance.  HENT:     Head: Normocephalic and atraumatic.  Eyes:     Pupils: Pupils are equal, round, and reactive to light.  Cardiovascular:     Rate and Rhythm: Normal rate.  Pulmonary:     Effort: Pulmonary effort is normal.  Abdominal:     Tenderness: There is no right CVA tenderness or left CVA tenderness.  Skin:    General: Skin is warm and dry.  Neurological:     General: No focal deficit present.     Mental Status: She is alert and oriented to person, place, and time.  Psychiatric:        Mood and Affect: Mood normal.        Behavior: Behavior normal.      UC Treatments / Results  Labs (all labs ordered are listed, but only abnormal results are displayed) Labs Reviewed  RPR  HIV ANTIBODY (ROUTINE TESTING W REFLEX)  POCT URINE PREGNANCY  POCT URINALYSIS DIP (MANUAL ENTRY)  CERVICOVAGINAL ANCILLARY ONLY    EKG   Radiology No results found.  Procedures Procedures (including critical care time)  Medications Ordered in UC Medications - No data to display  Initial Impression / Assessment and Plan / UC Course  I have reviewed the triage vital signs and the nursing notes.  Pertinent labs & imaging results that were available during my care of the patient were reviewed by me and considered in my medical decision making (see chart for details).     Reviewed exam and symptoms with patient.  No red flags.  UA negative.  STD testing is ordered and will contact for any positive results.  Given symptoms are similar to previous BV and yeast infection we will treat for this with Diflucan and metronidazole.  Side effect profile reviewed.  Patient to follow-up with gynecology if symptoms do not improve.  ER precautions reviewed and patient verbalized understanding. Final Clinical Impressions(s) / UC Diagnoses   Final diagnoses:  Acute vaginitis  Screening examination for STD (sexually transmitted  disease)      Discharge Instructions      The clinical contact you with results of the testing done today if positive.  Start metronidazole twice daily for 7 days as well as Diflucan as prescribed for your vaginal discharge.  Please follow-up with your PCP or gynecologist if your symptoms do not improve.  Please go to the emergency room for any worsening symptoms.  I hope you feel better soon!     ED Prescriptions     Medication Sig Dispense Auth. Provider   fluconazole (DIFLUCAN) 150 MG tablet Take 1 tablet (150 mg total) by mouth daily for 2 days. Take 1 tablet today and repeat in 3 days if symptoms persist 2 tablet Radford Pax, NP   metroNIDAZOLE (FLAGYL) 500 MG tablet Take 1 tablet (500 mg total) by mouth 2 (two) times daily. 14 tablet Radford Pax, NP      PDMP not reviewed this encounter.   Radford Pax, NP 05/03/23 [phone]

## 2023-05-03 NOTE — Discharge Instructions (Signed)
The clinical contact you with results of the testing done today if positive.  Start metronidazole twice daily for 7 days as well as Diflucan as prescribed for your vaginal discharge.  Please follow-up with your PCP or gynecologist if your symptoms do not improve.  Please go to the emergency room for any worsening symptoms.  I hope you feel better soon!

## 2023-05-04 LAB — RPR: RPR Ser Ql: NONREACTIVE

## 2023-05-04 LAB — HIV ANTIBODY (ROUTINE TESTING W REFLEX): HIV Screen 4th Generation wRfx: NONREACTIVE

## 2023-05-05 LAB — CERVICOVAGINAL ANCILLARY ONLY
Bacterial Vaginitis (gardnerella): POSITIVE — AB
Candida Glabrata: NEGATIVE
Candida Vaginitis: NEGATIVE
Chlamydia: NEGATIVE
Comment: NEGATIVE
Comment: NEGATIVE
Comment: NEGATIVE
Comment: NEGATIVE
Comment: NEGATIVE
Comment: NORMAL
Neisseria Gonorrhea: NEGATIVE
Trichomonas: NEGATIVE

## 2023-07-12 ENCOUNTER — Ambulatory Visit
Admission: EM | Admit: 2023-07-12 | Discharge: 2023-07-12 | Disposition: A | Discharge status: Home / Self Care | Payer: Medicaid Other | Attending: Internal Medicine | Admitting: Internal Medicine

## 2023-07-12 DIAGNOSIS — N898 Other specified noninflammatory disorders of vagina: Secondary | ICD-10-CM | POA: Diagnosis not present

## 2023-07-12 DIAGNOSIS — Z113 Encounter for screening for infections with a predominantly sexual mode of transmission: Secondary | ICD-10-CM

## 2023-07-12 MED ORDER — METRONIDAZOLE 500 MG PO TABS
500.0000 mg | ORAL_TABLET | Freq: Two times a day (BID) | ORAL | 0 refills | Status: DC
Start: 2023-07-12 — End: 2023-08-19

## 2023-07-12 MED ORDER — FLUCONAZOLE 150 MG PO TABS
150.0000 mg | ORAL_TABLET | Freq: Every day | ORAL | 0 refills | Status: AC
Start: 2023-07-12 — End: 2023-07-14

## 2023-07-12 NOTE — ED Provider Notes (Signed)
[facility]    CSN: [id number] Arrival date & time: 07/12/23  1827      History   Chief Complaint Chief Complaint  Patient presents with   vaginal issues    HPI Sheri Nunez is a 22 y.o. female presents for vaginal discharge.  Patient reports 2 days of a pruritic malodorous vaginal discharge.  Denies any dysuria, fevers, nausea/vomiting, flank pain.  No STD exposure but would like screening while here.  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  No OTC medications have been used since onset.  No other concerns at this time.  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 Authorizing Provider  fluconazole (DIFLUCAN) 150 MG tablet Take 1 tablet (150 mg total) by mouth daily for 2 doses. Take 1 tablet today and repeat in 3 days if symptoms persist 07/12/23 07/14/23 Yes Radford Pax, NP  metroNIDAZOLE (FLAGYL) 500 MG tablet Take 1 tablet (500 mg total) by mouth 2 (two) times daily. 07/12/23  Yes Radford Pax, NP  azithromycin (ZITHROMAX) 250 MG tablet Take 1 tablet (250 mg total) by mouth daily. Take first 2 tablets  together, then 1 every day until finished. 12/23/22   Radford Pax, NP  fluticasone (FLONASE) 50 MCG/ACT nasal spray Place 2 sprays into both nostrils daily. 09/20/22   Domenick Gong, MD  lidocaine (XYLOCAINE) 2 % solution Use as directed 15 mLs in the mouth or throat every 6 (six) hours as needed (Throat pain). Gargle and spit do not swallow 10/10/22   Radford Pax, NP  naproxen (NAPROSYN) 500 MG tablet Take 1 tablet (500 mg total) by mouth 2 (two) times daily with a meal. 02/25/23   Wallis Bamberg, PA-C    Family History Family History  Problem Relation Age of Onset   Healthy Mother    Healthy Father     Social History Social History   Tobacco Use   Smoking status: Never   Smokeless tobacco: Never  Vaping Use   Vaping status: Never Used  Substance Use Topics   Alcohol use: Not Currently   Drug use: No     Allergies   Patient has no known allergies.   Review of Systems Review of Systems  Genitourinary:  Positive for vaginal discharge.     Physical Exam Triage Vital Signs  ED Triage Vitals  Encounter Vitals Group     BP 07/12/23 1846 117/77     Systolic BP Percentile --      Diastolic BP Percentile --      Pulse Rate 07/12/23 1846 85     Resp 07/12/23 1846 17     Temp 07/12/23 1846 98 F (36.7 C)     Temp Source 07/12/23 1846 Oral     SpO2 07/12/23 1846 95 %     Weight --      Height --      Head Circumference --      Peak Flow --      Pain Score 07/12/23 1845 0     Pain Loc --      Pain Education --      Exclude from Growth Chart --    No data found.  Updated Vital Signs BP 117/77 (BP Location: Right Arm)   Pulse 85   Temp 98 F (36.7 C) (Oral)   Resp 17   LMP 06/16/2023 (Exact Date)   SpO2 95%   Visual Acuity Right Eye Distance:   Left Eye Distance:   Bilateral Distance:    Right Eye Near:   Left Eye Near:    Bilateral Near:     Physical Exam Vitals and nursing note reviewed.  Constitutional:      Appearance: Normal appearance.  HENT:      Head: Normocephalic and atraumatic.  Eyes:     Pupils: Pupils are equal, round, and reactive to light.  Cardiovascular:     Rate and Rhythm: Normal rate.  Pulmonary:     Effort: Pulmonary effort is normal.  Abdominal:     Tenderness: There is no right CVA tenderness or left CVA tenderness.  Skin:    General: Skin is warm and dry.  Neurological:     General: No focal deficit present.     Mental Status: She is alert and oriented to person, place, and time.  Psychiatric:        Mood and Affect: Mood normal.        Behavior: Behavior normal.      UC Treatments / Results  Labs (all labs ordered are listed, but only abnormal results are displayed) Labs Reviewed  CERVICOVAGINAL ANCILLARY ONLY    EKG   Radiology No results found.  Procedures Procedures (including critical care time)  Medications Ordered in UC Medications - No data to display  Initial Impression / Assessment and Plan / UC Course  I have reviewed the triage vital signs and the nursing notes.  Pertinent labs & imaging results that were available during my care of the patient were reviewed by me and considered in my medical decision making (see chart for details).     Reviewed exam and symptoms with patient.  No red flags.  Vaginal swab is ordered and will contact for any positive results.  The patient reports symptoms are consistent with both BV and yeast will start Diflucan and metronidazole, side effect profile reviewed.  PCP or GYN follow-up if symptoms do not improve.  ER precautions reviewed. Final Clinical Impressions(s) / UC Diagnoses   Final diagnoses:  Vaginal discharge  Screening examination for STD (sexually transmitted disease)     Discharge Instructions      The clinic will contact you with results of the vaginal swab done today if positive.  You may start Diflucan and metronidazole as prescribed.  Please follow-up with your PCP or gynecologist if your  symptoms do not improve.  Please  go to the ER for any worsening symptoms.  I hope you feel better soon!    ED Prescriptions     Medication Sig Dispense Auth. Provider   fluconazole (DIFLUCAN) 150 MG tablet Take 1 tablet (150 mg total) by mouth daily for 2 doses. Take 1 tablet today and repeat in 3 days if symptoms persist 2 tablet Radford Pax, NP   metroNIDAZOLE (FLAGYL) 500 MG tablet Take 1 tablet (500 mg total) by mouth 2 (two) times daily. 14 tablet Radford Pax, NP      PDMP not reviewed this encounter.   Radford Pax, NP 07/12/23 1859

## 2023-07-12 NOTE — ED Triage Notes (Signed)
Pt presents with c/o vaginal discharge x 2 days.   States she has recurrent yeast infections.

## 2023-07-12 NOTE — Discharge Instructions (Addendum)
The clinic will contact you with results of the vaginal swab done today if positive.  You may start Diflucan and metronidazole as prescribed.  Please follow-up with your PCP or gynecologist if your symptoms do not improve.  Please go to the ER for any worsening symptoms.  I hope you feel better soon!

## 2023-07-13 LAB — CERVICOVAGINAL ANCILLARY ONLY
Bacterial Vaginitis (gardnerella): POSITIVE — AB
Candida Glabrata: NEGATIVE
Candida Vaginitis: NEGATIVE
Chlamydia: NEGATIVE
Comment: NEGATIVE
Comment: NEGATIVE
Comment: NEGATIVE
Comment: NEGATIVE
Comment: NEGATIVE
Comment: NORMAL
Neisseria Gonorrhea: NEGATIVE
Trichomonas: NEGATIVE

## 2023-08-19 ENCOUNTER — Ambulatory Visit
Admission: EM | Admit: 2023-08-19 | Discharge: 2023-08-19 | Disposition: A | Discharge status: Home / Self Care | Payer: Medicaid Other | Attending: Internal Medicine | Admitting: Internal Medicine

## 2023-08-19 DIAGNOSIS — Z113 Encounter for screening for infections with a predominantly sexual mode of transmission: Secondary | ICD-10-CM

## 2023-08-19 DIAGNOSIS — N760 Acute vaginitis: Secondary | ICD-10-CM

## 2023-08-19 MED ORDER — METRONIDAZOLE 500 MG PO TABS
500.0000 mg | ORAL_TABLET | Freq: Two times a day (BID) | ORAL | 0 refills | Status: DC
Start: 2023-08-19 — End: 2023-10-20

## 2023-08-19 MED ORDER — FLUCONAZOLE 150 MG PO TABS
150.0000 mg | ORAL_TABLET | Freq: Every day | ORAL | 0 refills | Status: AC
Start: 2023-08-19 — End: 2023-08-21

## 2023-08-19 NOTE — Discharge Instructions (Signed)
The clinic will contact you with results of the vaginal swab testing done today if positive.  Start Flagyl twice daily for 7 days and Diflucan.  Follow-up with your gynecologist at your scheduled appointment this month.  Please go to the ER for any worsening symptoms.  Hope you feel better soon!

## 2023-08-19 NOTE — ED Provider Notes (Signed)
[facility]    CSN: [id number] Arrival date & time: 08/19/23  1103      History   Chief Complaint Chief Complaint  Patient presents with   SEXUALLY TRANSMITTED DISEASE    HPI Sheri Nunez is a 22 y.o. female presents for vaginal discharge.  Patient reports a couple days of a thick malodorous vaginal discharge with a fishy smell.  Denies any dysuria, hematuria, fevers, nausea/vomiting, flank pain.  No STD exposure but would like screening while here.  Has a history of both BV and yeast infections and reports this feels like BV.  No OTC medications have been used.  She is an appoint with gynecology at the end of the month.  No other concerns at this time.  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 Authorizing Provider  fluconazole (DIFLUCAN) 150 MG tablet Take 1 tablet (150 mg total) by mouth daily for 2 doses. Take 1 tablet today and repeat in 3 days 08/19/23 08/21/23 Yes Radford Pax, NP  metroNIDAZOLE (FLAGYL) 500 MG tablet Take 1 tablet (500 mg total) by mouth 2 (two) times daily. 08/19/23  Yes Radford Pax, NP  azithromycin (ZITHROMAX) 250 MG tablet Take 1 tablet (250 mg  total) by mouth daily. Take first 2 tablets together, then 1 every day until finished. 12/23/22   Radford Pax, NP  fluticasone (FLONASE) 50 MCG/ACT nasal spray Place 2 sprays into both nostrils daily. 09/20/22   Domenick Gong, MD  lidocaine (XYLOCAINE) 2 % solution Use as directed 15 mLs in the mouth or throat every 6 (six) hours as needed (Throat pain). Gargle and spit do not swallow 10/10/22   Radford Pax, NP  naproxen (NAPROSYN) 500 MG tablet Take 1 tablet (500 mg total) by mouth 2 (two) times daily with a meal. 02/25/23   Wallis Bamberg, PA-C    Family History Family History  Problem Relation Age of Onset   Healthy Mother    Healthy Father     Social History Social History   Tobacco Use   Smoking status: Never   Smokeless tobacco: Never  Vaping Use   Vaping status: Never Used  Substance Use Topics   Alcohol use: Not Currently   Drug use: No     Allergies   Patient has no known allergies.   Review of Systems Review of Systems  Genitourinary:  Positive for vaginal discharge.     Physical Exam Triage Vital Signs ED Triage Vitals  Encounter Vitals Group     BP 08/19/23 1147 101/66     Systolic BP Percentile --      Diastolic BP Percentile --      Pulse Rate 08/19/23 1147 (!) 103     Resp 08/19/23 1147 17     Temp 08/19/23 1147 98.2 F (36.8 C)     Temp Source 08/19/23 1147 Oral     SpO2 08/19/23 1147 98 %     Weight --      Height --      Head Circumference --      Peak Flow --      Pain Score 08/19/23 1146 0     Pain Loc --      Pain Education --      Exclude from Growth Chart --    No data found.  Updated Vital Signs BP 101/66 (BP Location: Right Arm)   Pulse (!) 103   Temp 98.2 F (36.8 C) (Oral)   Resp 17   LMP 07/25/2023 (Exact Date)   SpO2 98%   Visual Acuity Right Eye Distance:   Left Eye Distance:   Bilateral Distance:    Right Eye Near:   Left Eye Near:    Bilateral Near:     Physical Exam Vitals and nursing note reviewed.   Constitutional:      Appearance: Normal appearance.  HENT:     Head: Normocephalic and atraumatic.  Eyes:     Pupils: Pupils are equal, round, and reactive to light.  Cardiovascular:     Rate and Rhythm: Normal rate.  Pulmonary:     Effort: Pulmonary effort is normal.  Abdominal:     Tenderness: There is no right CVA tenderness or left CVA tenderness.  Skin:    General: Skin is warm and dry.  Neurological:     General: No focal deficit present.     Mental Status: She is alert and oriented to person, place, and time.  Psychiatric:        Mood and Affect: Mood normal.        Behavior: Behavior normal.      UC Treatments / Results  Labs (all labs ordered are listed, but only abnormal results are displayed) Labs Reviewed  CERVICOVAGINAL ANCILLARY ONLY    EKG   Radiology No results found.  Procedures Procedures (including critical care time)  Medications Ordered in UC Medications - No data to display  Initial Impression / Assessment and Plan / UC Course  I have reviewed the triage vital signs and the nursing notes.  Pertinent labs & imaging results that were available during my care of the patient were reviewed by me and considered in my medical decision making (see chart for details).     Vaginal swab was ordered we will contact for any positive results.  As patient reports symptoms consistent with BV will start Flagyl.  Patient also endorses antibiotic induced yeast infections, will start Diflucan.  Patient to follow-up with gynecologist at her scheduled appointment this month.  ER precautions reviewed. Final Clinical Impressions(s) / UC Diagnoses   Final diagnoses:  Acute vaginitis  Screening examination for STD (sexually transmitted disease)     Discharge Instructions      The clinic will contact you with results of the vaginal swab testing done today if positive.  Start Flagyl twice daily for 7 days and Diflucan.  Follow-up with your gynecologist at  your scheduled appointment this month.  Please go to the ER for any worsening symptoms.  Hope you feel better soon!    ED Prescriptions     Medication Sig Dispense Auth. Provider   metroNIDAZOLE (FLAGYL) 500 MG tablet Take 1 tablet (500 mg total) by mouth 2 (two) times daily. 14 tablet Radford Pax, NP   fluconazole (DIFLUCAN) 150 MG tablet Take 1 tablet (150 mg total) by mouth daily for 2 doses. Take 1 tablet today and repeat in 3 days 2 tablet Radford Pax, NP      PDMP not reviewed this encounter.   Radford Pax, NP 08/19/23 [phone]

## 2023-08-19 NOTE — ED Triage Notes (Signed)
Pt presents for STD testing.  Does not want blood work.   Pt states she has a hx of bv and yeast.  Pt states she vaginal discharge.

## 2023-08-20 LAB — CERVICOVAGINAL ANCILLARY ONLY
Bacterial Vaginitis (gardnerella): NEGATIVE
Candida Glabrata: NEGATIVE
Candida Vaginitis: NEGATIVE
Chlamydia: NEGATIVE
Comment: NEGATIVE
Comment: NEGATIVE
Comment: NEGATIVE
Comment: NEGATIVE
Comment: NEGATIVE
Comment: NORMAL
Neisseria Gonorrhea: NEGATIVE
Trichomonas: NEGATIVE

## 2023-10-20 ENCOUNTER — Ambulatory Visit
Admission: EM | Admit: 2023-10-20 | Discharge: 2023-10-20 | Disposition: A | Discharge status: Home / Self Care | Payer: Medicaid Other | Attending: Family Medicine | Admitting: Family Medicine

## 2023-10-20 DIAGNOSIS — B9689 Other specified bacterial agents as the cause of diseases classified elsewhere: Secondary | ICD-10-CM

## 2023-10-20 DIAGNOSIS — N760 Acute vaginitis: Secondary | ICD-10-CM | POA: Diagnosis not present

## 2023-10-20 LAB — POCT URINE PREGNANCY: Preg Test, Ur: NEGATIVE

## 2023-10-20 MED ORDER — METRONIDAZOLE 500 MG PO TABS: 500.0000 mg | ORAL_TABLET | Freq: Two times a day (BID) | ORAL | 0 refills | Status: DC

## 2023-10-20 MED ORDER — FLUCONAZOLE 150 MG PO TABS: 150.0000 mg | ORAL_TABLET | ORAL | 0 refills | Status: DC

## 2023-10-20 NOTE — ED Triage Notes (Signed)
 Pt c/o vaginal d/c with odor x 2 days-states she has hx of BV with same sx-NAD-steady gait

## 2023-10-20 NOTE — ED Provider Notes (Signed)
 [facility]  Note:  This document was prepared using Conservation officer, historic buildings and may include unintentional dictation errors.  MRN: [id number] DOB: 02-25-2001  Subjective:   Sheri Nunez is a 23 y.o. female presenting for 2-day history of acute onset vaginal discharge with malodor.  Has a history of BV infection.  No urinary symptoms.  Patient would like empiric treatment.  No chronic medications.  No Known Allergies  Past Medical History:  Diagnosis Date   UTI (urinary tract infection)      Past Surgical History:  Procedure Laterality Date   NO PAST SURGERIES      Family History  Problem Relation Age of Onset   Healthy Mother    Healthy Father     Social History   Tobacco Use   Smoking status: Never   Smokeless tobacco: Never  Vaping Use   Vaping status: Never Used  Substance Use Topics   Alcohol use: Yes    Comment: occ   Drug use: No    ROS   Objective:   Vitals: BP 121/85 (BP Location: Left Arm)   Pulse 88   Temp 98.9 F (37.2 C) (Oral)   Resp 16   LMP 09/23/2023   SpO2 97%   Physical Exam Constitutional:      General: She is not in acute distress.    Appearance: Normal appearance. She is well-developed. She is not ill-appearing, toxic-appearing or diaphoretic.  HENT:     Head: Normocephalic and atraumatic.     Nose: Nose normal.     Mouth/Throat:     Mouth: Mucous membranes are moist.     Pharynx: Oropharynx is clear.  Eyes:     General: No scleral icterus.       Right eye: No discharge.        Left eye: No discharge.     Extraocular Movements: Extraocular movements intact.     Conjunctiva/sclera: Conjunctivae normal.  Cardiovascular:     Rate and Rhythm: Normal rate.  Pulmonary:     Effort: Pulmonary effort is normal.  Abdominal:     General: Bowel sounds are normal. There is no distension.     Palpations: Abdomen is soft. There is no mass.     Tenderness: There is no abdominal tenderness.  There is no right CVA tenderness, left CVA tenderness, guarding or rebound.  Skin:    General: Skin is warm and dry.  Neurological:     General: No focal deficit present.     Mental Status: She is alert and oriented to person, place, and time.  Psychiatric:        Mood and Affect: Mood normal.        Behavior: Behavior normal.        Thought Content: Thought content normal.        Judgment: Judgment normal.     Results for orders placed or performed during the hospital encounter of 10/20/23 (from the past 24 hours)  POCT urine pregnancy     Status: None   Collection Time: 10/20/23  5:23 PM  Result Value Ref Range   Preg Test, Ur Negative Negative    Assessment and Plan :   PDMP not reviewed this encounter.  1. Bacterial vaginosis   2. Acute vaginitis    We will treat patient empirically for bacterial vaginosis with Flagyl  and for yeast vaginitis with fluconazole .  Labs pending. Counseled patient on potential for adverse effects with medications prescribed/recommended today, ER  and return-to-clinic precautions discussed, patient verbalized understanding.    Christopher Savannah, [place] 10/20/23 1746

## 2023-10-21 LAB — CERVICOVAGINAL ANCILLARY ONLY
Bacterial Vaginitis (gardnerella): NEGATIVE
Candida Glabrata: NEGATIVE
Candida Vaginitis: NEGATIVE
Chlamydia: NEGATIVE
Comment: NEGATIVE
Comment: NEGATIVE
Comment: NEGATIVE
Comment: NEGATIVE
Comment: NEGATIVE
Comment: NORMAL
Neisseria Gonorrhea: NEGATIVE
Trichomonas: NEGATIVE

## 2023-12-30 ENCOUNTER — Ambulatory Visit
Admission: EM | Admit: 2023-12-30 | Discharge: 2023-12-30 | Disposition: A | Discharge status: Home / Self Care | Attending: Family Medicine | Admitting: Family Medicine

## 2023-12-30 DIAGNOSIS — N760 Acute vaginitis: Secondary | ICD-10-CM | POA: Insufficient documentation

## 2023-12-30 LAB — POCT URINE PREGNANCY: Preg Test, Ur: NEGATIVE

## 2023-12-30 MED ORDER — FLUCONAZOLE 150 MG PO TABS: 150.0000 mg | ORAL_TABLET | ORAL | 0 refills | Status: DC

## 2023-12-30 NOTE — ED Triage Notes (Signed)
 Pt c/o vaginal discharge x2 days.No home interventions.

## 2023-12-30 NOTE — ED Provider Notes (Signed)
  [facility]  Note:  This document was prepared using Conservation officer, historic buildings and may include unintentional dictation errors.  MRN: [id number] DOB: 2001/08/26  Subjective:   Sheri Nunez is a 23 y.o. female presenting for 2-day history of recurrent vaginal discharge, vaginal irritation and itching.  Has had a history of BV and yeast infection.  Would like to be treated empirically.  No vaginal malodor.  No fever, nausea, vomiting, abdominal pelvic pain, urinary symptoms.  She is in the process of finding a new OB/GYN.  No chronic medications.  No Known Allergies  Past Medical History:  Diagnosis Date   UTI (urinary tract infection)      Past Surgical History:  Procedure Laterality Date   NO PAST SURGERIES      Family History  Problem Relation Age of Onset   Healthy Mother    Healthy Father     Social History   Tobacco Use   Smoking status: Never   Smokeless tobacco: Never  Vaping Use   Vaping status: Never Used  Substance Use Topics   Alcohol use: Yes    Comment: occ   Drug use: No    ROS   Objective:   Vitals: BP 112/74 (BP Location: Left Arm)   Pulse 73   Temp 98.1 F (36.7 C) (Oral)   Resp 20   LMP 11/20/2023 (Exact Date)   SpO2 98%   Physical Exam Constitutional:      General: She is not in acute distress.    Appearance: Normal appearance. She is well-developed. She is not ill-appearing, toxic-appearing or diaphoretic.  HENT:     Head: Normocephalic and atraumatic.     Nose: Nose normal.     Mouth/Throat:     Mouth: Mucous membranes are moist.  Eyes:     General: No scleral icterus.       Right eye: No discharge.        Left eye: No discharge.     Extraocular Movements: Extraocular movements intact.  Cardiovascular:     Rate and Rhythm: Normal rate.  Pulmonary:     Effort: Pulmonary effort is normal.  Skin:    General: Skin is warm and dry.  Neurological:     General: No focal deficit present.      Mental Status: She is alert and oriented to person, place, and time.  Psychiatric:        Mood and Affect: Mood normal.        Behavior: Behavior normal.     Results for orders placed or performed during the hospital encounter of 12/30/23 (from the past 24 hours)  POCT urine pregnancy     Status: None   Collection Time: 12/30/23  9:27 AM  Result Value Ref Range   Preg Test, Ur Negative Negative    Assessment and Plan :   PDMP not reviewed this encounter.  1. Acute vaginitis    Will use an extended course of fluconazole to address recurrent acute vaginitis.  Provided the patient with information to follow-up with a new gynecologist.  Will base further treatment off of results.  Counseled patient on potential for adverse effects with medications prescribed/recommended today, ER and return-to-clinic precautions discussed, patient verbalized understanding.    Wallis Bamberg, [place] 12/30/23 [phone]

## 2023-12-31 LAB — CERVICOVAGINAL ANCILLARY ONLY
Bacterial Vaginitis (gardnerella): NEGATIVE
Candida Glabrata: NEGATIVE
Candida Vaginitis: NEGATIVE
Chlamydia: NEGATIVE
Comment: NEGATIVE
Comment: NEGATIVE
Comment: NEGATIVE
Comment: NEGATIVE
Comment: NEGATIVE
Comment: NORMAL
Neisseria Gonorrhea: NEGATIVE
Trichomonas: NEGATIVE

## 2024-02-22 ENCOUNTER — Encounter: Payer: Self-pay | Admitting: *Deleted

## 2024-03-16 ENCOUNTER — Ambulatory Visit: Payer: Self-pay

## 2024-04-27 ENCOUNTER — Ambulatory Visit
Admission: EM | Admit: 2024-04-27 | Discharge: 2024-04-27 | Disposition: A | Discharge status: Home / Self Care | Attending: Family Medicine | Admitting: Family Medicine

## 2024-04-27 DIAGNOSIS — R300 Dysuria: Secondary | ICD-10-CM | POA: Diagnosis not present

## 2024-04-27 DIAGNOSIS — N898 Other specified noninflammatory disorders of vagina: Secondary | ICD-10-CM | POA: Insufficient documentation

## 2024-04-27 DIAGNOSIS — Z113 Encounter for screening for infections with a predominantly sexual mode of transmission: Secondary | ICD-10-CM | POA: Diagnosis not present

## 2024-04-27 LAB — POCT URINE DIPSTICK
Bilirubin, UA: NEGATIVE
Glucose, UA: NEGATIVE mg/dL
Ketones, POC UA: NEGATIVE mg/dL
Nitrite, UA: NEGATIVE
POC PROTEIN,UA: NEGATIVE
Spec Grav, UA: 1.015 (ref 1.010–1.025)
Urobilinogen, UA: 0.2 U/dL
pH, UA: 7 (ref 5.0–8.0)

## 2024-04-27 LAB — POCT URINE PREGNANCY: Preg Test, Ur: NEGATIVE

## 2024-04-27 MED ORDER — METRONIDAZOLE 500 MG PO TABS: 500.0000 mg | ORAL_TABLET | Freq: Two times a day (BID) | ORAL | 0 refills | Status: DC

## 2024-04-27 NOTE — ED Triage Notes (Signed)
 Pt present with vaginal discharge x one week.  Reports recurrent bv and yeast infection. Pt also requesting STD testing and has had a new partner.  Denies any pain. Reports dysuria during her menstrual cycle.

## 2024-04-27 NOTE — Discharge Instructions (Addendum)
 The clinic will contact you with results of the testing done today if positive.  Start metronidazole  twice daily for 7 days.  Lots of rest and fluids.  Please follow-up with your PCP or gynecologist if symptoms do not improve.  Please go to the ER for any worsening symptoms.  Hope you feel better soon!

## 2024-04-27 NOTE — ED Provider Notes (Signed)
 [facility]    CSN: [id number] Arrival date & time: 04/27/24  1157      History   Chief Complaint Chief Complaint  Patient presents with   Vaginal Discharge   Dysuria    HPI Sheri Nunez is a 23 y.o. female presents for vaginal discharge and dysuria.  Patient reports 1 week of a slightly malodorous nonpruritic vaginal discharge.  She denies any rashes.  Reports some dysuria but denies urgency, frequency, hematuria, fevers, nausea/vomiting, flank pain.  Would like STD screening as she has a new partner but denies any known exposure.  Has a history of recurrent BV infections and states this feels similar to that.  No OTC treatments have been used since symptom onset.  No other concerns at this time.   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 Authorizing Provider  metroNIDAZOLE  (FLAGYL ) 500 MG tablet Take 1 tablet (500 mg total) by mouth 2 (two) times daily. 04/27/24  Yes Audrianna Driskill, Jodi R, NP  fluconazole  (DIFLUCAN ) 150 MG tablet Take 1 tablet (150 mg total) by mouth every 3 (three)  days. 12/30/23   Christopher Savannah, PA-C    Family History Family History  Problem Relation Age of Onset   Healthy Mother    Healthy Father     Social History Social History   Tobacco Use   Smoking status: Never   Smokeless tobacco: Never  Vaping Use   Vaping status: Never Used  Substance Use Topics   Alcohol use: Yes    Comment: occ   Drug use: No     Allergies   Patient has no known allergies.   Review of Systems Review of Systems  Genitourinary:  Positive for dysuria and vaginal discharge.     Physical Exam Triage Vital Signs ED Triage Vitals [04/27/24 1211]  Encounter Vitals Group     BP 111/75     Girls Systolic BP Percentile      Girls Diastolic BP Percentile      Boys Systolic BP Percentile      Boys Diastolic BP Percentile      Pulse Rate 85     Resp 17     Temp 99.2 F (37.3 C)     Temp Source Oral     SpO2 97 %  Weight      Height      Head Circumference      Peak Flow      Pain Score      Pain Loc      Pain Education      Exclude from Growth Chart    No data found.  Updated Vital Signs BP 111/75 (BP Location: Right Arm)   Pulse 85   Temp 99.2 F (37.3 C) (Oral)   Resp 17   LMP 04/12/2024 (Exact Date)   SpO2 97%   Visual Acuity Right Eye Distance:   Left Eye Distance:   Bilateral Distance:    Right Eye Near:   Left Eye Near:    Bilateral Near:     Physical Exam Vitals and nursing note reviewed.  Constitutional:      Appearance: Normal appearance.  HENT:     Head: Normocephalic and atraumatic.  Eyes:     Pupils: Pupils are equal, round, and reactive to light.  Cardiovascular:     Rate and Rhythm: Normal rate.  Pulmonary:     Effort: Pulmonary effort is normal.  Abdominal:     Tenderness: There is no right CVA tenderness or left CVA tenderness.  Skin:    General: Skin is warm and dry.  Neurological:     General: No focal deficit present.     Mental Status: She is alert and oriented to person, place, and time.   Psychiatric:        Mood and Affect: Mood normal.        Behavior: Behavior normal.      UC Treatments / Results  Labs (all labs ordered are listed, but only abnormal results are displayed) Labs Reviewed  POCT URINE DIPSTICK - Abnormal; Notable for the following components:      Result Value   Clarity, UA cloudy (*)    Blood, UA trace-intact (*)    All other components within normal limits  URINE CULTURE  RPR  HIV ANTIBODY (ROUTINE TESTING W REFLEX)  POCT URINE PREGNANCY  CERVICOVAGINAL ANCILLARY ONLY    EKG   Radiology No results found.  Procedures Procedures (including critical care time)  Medications Ordered in UC Medications - No data to display  Initial Impression / Assessment and Plan / UC Course  I have reviewed the triage vital signs and the nursing notes.  Pertinent labs & imaging results that were available during my care of the patient were reviewed by me and considered in my medical decision making (see chart for details).     Reviewed exam and symptoms with patient.  No red flags.  STD testing/vaginal swab as ordered and will contact for any positive results.  Also send urine culture given symptoms.  Will start metronidazole  for BV symptoms.  Advised rest fluids and PCP or GYN follow-up if symptoms do not improve.  ER precautions reviewed. Final Clinical Impressions(s) / UC Diagnoses   Final diagnoses:  Dysuria  Vaginal discharge  Screening examination for STD (sexually transmitted disease)     Discharge Instructions      The clinic will contact you with results of the testing done today if positive.  Start metronidazole  twice daily for 7 days.  Lots of rest and fluids.  Please follow-up with your PCP or gynecologist if symptoms do not improve.  Please go to the ER for any worsening symptoms.  Hope you feel better soon!     ED Prescriptions     Medication Sig Dispense  Auth. Provider   metroNIDAZOLE  (FLAGYL ) 500 MG tablet Take 1 tablet (500  mg total) by mouth 2 (two) times daily. 14 tablet Calven Gilkes, Jodi R, NP      PDMP not reviewed this encounter.   Loreda Myla SAUNDERS, NP 04/27/24 1250

## 2024-04-28 LAB — CERVICOVAGINAL ANCILLARY ONLY
Bacterial Vaginitis (gardnerella): NEGATIVE
Candida Glabrata: NEGATIVE
Candida Vaginitis: NEGATIVE
Chlamydia: NEGATIVE
Comment: NEGATIVE
Comment: NEGATIVE
Comment: NEGATIVE
Comment: NEGATIVE
Comment: NEGATIVE
Comment: NORMAL
Neisseria Gonorrhea: NEGATIVE
Trichomonas: NEGATIVE

## 2024-04-28 LAB — HIV ANTIBODY (ROUTINE TESTING W REFLEX): HIV Screen 4th Generation wRfx: NONREACTIVE

## 2024-04-28 LAB — RPR: RPR Ser Ql: NONREACTIVE

## 2024-04-30 LAB — URINE CULTURE: Culture: 30000 — AB

## 2024-05-01 ENCOUNTER — Ambulatory Visit (HOSPITAL_COMMUNITY): Payer: Self-pay

## 2024-05-01 MED ORDER — AMOXICILLIN 500 MG PO CAPS: 500.0000 mg | ORAL_CAPSULE | Freq: Two times a day (BID) | ORAL | 0 refills | Status: AC

## 2024-05-31 ENCOUNTER — Ambulatory Visit
Admission: EM | Admit: 2024-05-31 | Discharge: 2024-05-31 | Disposition: A | Discharge status: Home / Self Care | Attending: Family Medicine | Admitting: Family Medicine

## 2024-05-31 DIAGNOSIS — N898 Other specified noninflammatory disorders of vagina: Secondary | ICD-10-CM | POA: Diagnosis not present

## 2024-05-31 DIAGNOSIS — R300 Dysuria: Secondary | ICD-10-CM | POA: Insufficient documentation

## 2024-05-31 DIAGNOSIS — N3001 Acute cystitis with hematuria: Secondary | ICD-10-CM | POA: Diagnosis not present

## 2024-05-31 LAB — POCT URINE DIPSTICK
Bilirubin, UA: NEGATIVE
Glucose, UA: NEGATIVE mg/dL
Ketones, POC UA: NEGATIVE mg/dL
Leukocytes, UA: NEGATIVE
Nitrite, UA: NEGATIVE
POC PROTEIN,UA: NEGATIVE
Spec Grav, UA: 1.025 (ref 1.010–1.025)
Urobilinogen, UA: 0.2 U/dL
pH, UA: 5.5 (ref 5.0–8.0)

## 2024-05-31 MED ORDER — FLUCONAZOLE 150 MG PO TABS
150.0000 mg | ORAL_TABLET | ORAL | 0 refills | Status: AC
Start: 2024-05-31 — End: 2024-06-15

## 2024-05-31 MED ORDER — CEPHALEXIN 500 MG PO CAPS: 500.0000 mg | ORAL_CAPSULE | Freq: Two times a day (BID) | ORAL | 0 refills | Status: DC

## 2024-05-31 NOTE — ED Provider Notes (Signed)
 [facility]  Note:  This document was prepared using Conservation officer, historic buildings and may include unintentional dictation errors.  MRN: [id number] DOB: Feb 05, 2001  Subjective:   Sheri Nunez is a 23 y.o. female presenting for 3-day history of dysuria, urinary frequency and irritation.  Has also had vaginal discharge.  Would like STI testing.  Her last urinary tract infection was a month ago.  Admits that she is having a difficult time hydrating with plain water.  She does drink other urinary irritants.  No current facility-administered medications for this encounter.  Current Outpatient Medications:    fluconazole  (DIFLUCAN ) 150 MG tablet, Take 1 tablet (150 mg total) by mouth every 3 (three) days., Disp: 5 tablet, Rfl: 0   metroNIDAZOLE  (FLAGYL ) 500 MG tablet, Take 1 tablet (500 mg total) by mouth 2 (two) times daily., Disp: 14 tablet, Rfl: 0   No Known Allergies  Past Medical History:  Diagnosis Date   UTI (urinary tract infection)      Past Surgical History:  Procedure Laterality Date   NO PAST SURGERIES      Family History  Problem Relation Age of Onset   Healthy Mother    Healthy Father     Social History   Tobacco Use   Smoking status: Never   Smokeless tobacco: Never  Vaping Use   Vaping status: Never Used  Substance Use Topics   Alcohol use: Yes    Comment: occ   Drug use: No    ROS   Objective:   Vitals: BP 124/85 (BP Location: Left Arm)   Pulse 88   Temp 98.2 F (36.8 C) (Oral)   Resp 16   LMP 05/07/2024   SpO2 99%   Physical Exam Constitutional:      General: She is not in acute distress.    Appearance: Normal appearance. She is well-developed. She is not ill-appearing, toxic-appearing or diaphoretic.  HENT:     Head: Normocephalic and atraumatic.     Nose: Nose normal.     Mouth/Throat:     Mouth: Mucous membranes are moist.  Eyes:     General: No scleral icterus.       Right eye: No discharge.         Left eye: No discharge.     Extraocular Movements: Extraocular movements intact.     Conjunctiva/sclera: Conjunctivae normal.  Cardiovascular:     Rate and Rhythm: Normal rate.  Pulmonary:     Effort: Pulmonary effort is normal.  Abdominal:     General: Bowel sounds are normal. There is no distension.     Palpations: Abdomen is soft. There is no mass.     Tenderness: There is no abdominal tenderness. There is no right CVA tenderness, left CVA tenderness, guarding or rebound.  Skin:    General: Skin is warm and dry.  Neurological:     General: No focal deficit present.     Mental Status: She is alert and oriented to person, place, and time.  Psychiatric:        Mood and Affect: Mood normal.        Behavior: Behavior normal.        Thought Content: Thought content normal.        Judgment: Judgment normal.     Results for orders placed or performed during the hospital encounter of 05/31/24 (from the past 24 hours)  POCT URINE DIPSTICK     Status: Abnormal   Collection Time:  05/31/24  7:24 PM  Result Value Ref Range   Color, UA yellow yellow   Clarity, UA hazy (A) clear   Glucose, UA negative negative mg/dL   Bilirubin, UA negative negative   Ketones, POC UA negative negative mg/dL   Spec Grav, UA 8.974 8.989 - 1.025   Blood, UA trace-intact (A) negative   pH, UA 5.5 5.0 - 8.0   POC PROTEIN,UA negative negative, trace   Urobilinogen, UA 0.2 0.2 or 1.0 E.U./dL   Nitrite, UA Negative Negative   Leukocytes, UA Negative Negative    Assessment and Plan :   PDMP not reviewed this encounter.  1. Acute cystitis with hematuria   2. Dysuria   3. Vaginal discharge    Declines UPT.  Start cephalexin  (based off of previous urine culture from August) to cover for acute cystitis, urine culture pending.  Vaginal cytology pending.  Recommended consistent hydration, limiting urinary irritants. Counseled patient on potential for adverse effects with medications prescribed/recommended  today, ER and return-to-clinic precautions discussed, patient verbalized understanding.    Christopher Savannah, [place] 05/31/24 8062

## 2024-05-31 NOTE — ED Triage Notes (Addendum)
 Pt c/o dysuria and vaginal d/c x 3 days-no meds PTA-NAD-steady gait

## 2024-05-31 NOTE — Discharge Instructions (Signed)
 Please start cephalexin  to address an urinary tract infection. Make sure you hydrate very well with plain water and a quantity of 80 ounces of water a day.  Please limit drinks that are considered urinary irritants such as fruit juices, soda, sweet tea, coffee, artifical sweetened drinks, energy drinks, alcohol.  These can worsen your urinary and genital symptoms but also be the source of them.  I will let you know about your vaginal swab and urine culture results through MyChart to see if we need to prescribe or change your antibiotics based off of those results.

## 2024-06-01 LAB — URINE CULTURE

## 2024-06-01 LAB — CERVICOVAGINAL ANCILLARY ONLY
Bacterial Vaginitis (gardnerella): NEGATIVE
Candida Glabrata: NEGATIVE
Candida Vaginitis: POSITIVE — AB
Chlamydia: NEGATIVE
Comment: NEGATIVE
Comment: NEGATIVE
Comment: NEGATIVE
Comment: NEGATIVE
Comment: NEGATIVE
Comment: NORMAL
Neisseria Gonorrhea: NEGATIVE
Trichomonas: NEGATIVE

## 2024-08-24 ENCOUNTER — Other Ambulatory Visit (HOSPITAL_COMMUNITY): Admission: RE | Admit: 2024-08-24 | Source: Ambulatory Visit

## 2024-08-24 ENCOUNTER — Ambulatory Visit
Admission: EM | Admit: 2024-08-24 | Discharge: 2024-08-24 | Disposition: A | Discharge status: Home / Self Care | Attending: Nurse Practitioner | Admitting: Nurse Practitioner

## 2024-08-24 ENCOUNTER — Telehealth: Payer: Self-pay | Admitting: Nurse Practitioner

## 2024-08-24 DIAGNOSIS — N760 Acute vaginitis: Secondary | ICD-10-CM | POA: Diagnosis present

## 2024-08-24 LAB — POCT URINE DIPSTICK
Bilirubin, UA: NEGATIVE
Glucose, UA: NEGATIVE mg/dL
Ketones, POC UA: NEGATIVE mg/dL
Nitrite, UA: NEGATIVE
POC PROTEIN,UA: NEGATIVE
Spec Grav, UA: 1.02 (ref 1.010–1.025)
Urobilinogen, UA: 0.2 U/dL
pH, UA: 7.5 (ref 5.0–8.0)

## 2024-08-24 LAB — POCT URINE PREGNANCY: Preg Test, Ur: NEGATIVE

## 2024-08-24 NOTE — Discharge Instructions (Addendum)
 You were seen today for vaginal discharge, which can occur when the normal balance of bacteria in the vagina changes. This can be influenced by various factors, including new or multiple sexual partners, unprotected sex, douching, smoking, certain antibiotics, or pregnancy. Its also possible to develop a vaginal infection even without being sexually active. Tests were performed today to check for bacteria, yeast, gonorrhea, chlamydia, trichomonas, HIV and syphilis. A urine culture has also been sent.  While results are pending, treatment has been initiated for the most common non-STD causes--bacterial vaginosis and yeast infection. It is important that you avoid any sexual activity until your test results have returned, your treatment is complete, and your symptoms have fully resolved. You will only be contacted if any of your test results are positive. You can also review your results through your MyChart account. During this time, avoid douching or using vaginal sprays or deodorants. Wear cotton or cotton-lined underwear to improve airflow and reduce moisture. Be sure to stay hydrated by drinking plenty of fluids. Please note that while you are taking the prescribed Flagyl  (metronidazole ) it is very important to avoid drinking alcohol while on this medication. Drinking alcohol can cause severe side effects, such as nausea, vomiting, and headaches. See your regular doctor or gynecologist if your symptoms do not start to improve with treatment. Go to the emergency room right away if you develop a fever, new or worsening pelvic pain, or if the vaginal discharge gets worse.

## 2024-08-24 NOTE — ED Triage Notes (Signed)
 Pt states she would like to be tested for STD's states she is having a little bit of brown vaginal discharge.

## 2024-08-24 NOTE — ED Provider Notes (Signed)
 [facility]    CSN: [id number] Arrival date & time: 08/24/24  1205      History   Chief Complaint Chief Complaint  Patient presents with   Vaginal Itching    HPI Sheri Nunez is a 23 y.o. female.   Discussed the use of AI scribe software for clinical note transcription with the patient, who gave verbal consent to proceed.   The patient is a sexually active female presenting with brown vaginal discharge accompanied by mild irritation and discomfort. She reports being prone to bacterial infections and feels that anything can throw me off. The discharge is described as minimal, with no strong odor, though she notes a slight odor may be present. She also reports mild burning or discomfort with urination, which she describes as noticeable but not severe. Her last menstrual period was one week ago.  She has had one female sexual partner in the past three months and reports intermittent condom use. She denies nausea, vomiting, low back pain, abdominal pain, or fevers. Given her history of frequent bacterial vaginosis and current symptoms, she is seeking evaluation and STD testing for reassurance.  The following sections of the patient's history were reviewed and updated as appropriate: allergies, current medications, past family history, past medical history, past social history, past surgical history, and problem list.     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 Authorizing Provider  fluconazole  (DIFLUCAN ) 150 MG tablet Take 1 tablet (150 mg total) by mouth every 3 (three) days for 2 doses. 08/24/24 08/28/24 Yes Iola Lukes, FNP  naproxen  (NAPROSYN ) 500 MG tablet Take 1 tablet (500 mg total) by mouth 2 (two) times daily with a meal. Take with food to avoid stomach upset. Do not take any additional NSAIDs while on this. You may take tylenol  in addition to this if needed for extra pain relief. 08/24/24  Yes Iola Lukes, FNP    Family History Family History  Problem Relation Age of Onset   Healthy Mother    Healthy Father     Social History Social History[1]   Allergies   Patient has no known allergies.   Review of Systems Review of Systems  Constitutional:  Negative for fever.  Gastrointestinal:  Negative for abdominal pain, nausea and vomiting.  Genitourinary:  Positive for dysuria (a little) and vaginal discharge (brown). Negative for menstrual problem (LMP 1 week ago).  Vaginal irritation and discomfort. No odor  Musculoskeletal:  Negative for back pain.  All other systems reviewed and are negative.    Physical Exam Triage Vital Signs ED Triage Vitals  Encounter Vitals Group     BP 08/24/24 1237 125/82     Girls Systolic BP Percentile --      Girls Diastolic BP Percentile --      Boys Systolic BP Percentile --      Boys Diastolic BP Percentile --      Pulse Rate 08/24/24 1237 72     Resp 08/24/24 1237 16     Temp 08/24/24 1237 98.2 F (36.8 C)     Temp Source 08/24/24 1237 Oral     SpO2 08/24/24 1237 98 %     Weight --      Height --      Head Circumference --      Peak Flow --      Pain Score 08/24/24 1236 0     Pain  Loc --      Pain Education --      Exclude from Growth Chart --    No data found.  Updated Vital Signs BP 125/82 (BP Location: Left Arm)   Pulse 72   Temp 98.2 F (36.8 C) (Oral)   Resp 16   LMP 08/16/2024 (Approximate)   SpO2 98%   Visual Acuity Right Eye Distance:   Left Eye Distance:   Bilateral Distance:    Right Eye Near:   Left Eye Near:    Bilateral Near:     Physical Exam Constitutional:      General: She is not in acute distress.    Appearance: Normal appearance. She is not ill-appearing, toxic-appearing or diaphoretic.  HENT:     Head: Normocephalic.     Nose: Nose normal.     Mouth/Throat:     Mouth: Mucous membranes are moist.  Eyes:     Conjunctiva/sclera: Conjunctivae normal.  Cardiovascular:     Rate and Rhythm: Normal rate.  Pulmonary:     Effort: Pulmonary effort is normal.  Abdominal:     Palpations: Abdomen is soft.  Genitourinary:    Comments: Deferred; patient performed self-swab for Aptima testing  Musculoskeletal:        General: Normal range of motion.     Cervical back: Normal range of motion and neck supple.  Skin:    General: Skin is warm and dry.  Neurological:     General: No focal deficit present.     Mental Status: She is alert and oriented to person, place, and time.  Psychiatric:        Mood and Affect: Mood normal.        Behavior: Behavior normal.      UC Treatments / Results  Labs (all labs ordered are listed, but only abnormal results are displayed) Labs Reviewed  POCT URINE DIPSTICK - Abnormal; Notable for the following components:      Result Value   Blood, UA trace-intact (*)    Leukocytes, UA Trace (*)    All other components within normal limits  HIV ANTIBODY (ROUTINE TESTING W REFLEX)  SYPHILIS: RPR W/REFLEX TO RPR TITER AND TREPONEMAL ANTIBODIES, TRADITIONAL SCREENING AND DIAGNOSIS ALGORITHM  POCT URINE PREGNANCY    EKG   Radiology No results found.  Procedures Procedures (including critical  care time)  Medications Ordered in UC Medications - No data to display  Initial Impression / Assessment and Plan / UC  Course  I have reviewed the triage vital signs and the nursing notes.  Pertinent labs & imaging results that were available during my care of the patient were reviewed by me and considered in my medical decision making (see chart for details).     The patient presents with mild vaginal discharge, irritation, and dysuria consistent with possible bacterial vaginosis or yeast infection, and she also requests STD testing given her sexual activity and history of recurrent vaginal infections. She is afebrile, denies abdominal or back pain, and symptoms are mild without signs concerning for pelvic inflammatory disease. A vaginal swab was obtained for chlamydia, gonorrhea, trichomonas, bacterial vaginosis, and yeast, and HIV and syphilis testing were ordered per her request. Given her symptom pattern and recurrent history, empiric treatment for both BV and yeast was initiated with metronidazole  and Diflucan . She was counseled to avoid all alcohol while taking metronidazole  due to the risk of severe nausea and vomiting. A urine sample was sent for urinalysis and culture to rule out urinary infection. For prevention of recurrent BV, she was advised to start over-the-counter boric acid suppositories daily for 3-6 weeks after completing treatment. She was instructed to follow up with gynecology if symptoms do not improve or recur and to seek urgent care or emergency evaluation if she develops fever, worsening pelvic or abdominal pain, vomiting, or any new concerning symptoms.  Today's evaluation has revealed no signs of a dangerous process. Discussed diagnosis with patient and/or guardian. Patient and/or guardian aware of their diagnosis, possible red flag symptoms to watch out for and need for close follow up. Patient and/or guardian understands verbal and written discharge instructions. Patient  and/or guardian comfortable with plan and disposition.  Patient and/or guardian has a clear mental status at this time, good insight into illness (after discussion and teaching) and has clear judgment to make decisions regarding their care  Documentation was completed with the aid of voice recognition software. Transcription may contain typographical errors.  Final Clinical Impressions(s) / UC Diagnoses   Final diagnoses:  Acute vaginitis     Discharge Instructions      You were seen today for vaginal discharge, which can occur when the normal balance of bacteria in the vagina changes. This can be influenced by various factors, including new or multiple sexual partners, unprotected sex, douching, smoking, certain antibiotics, or pregnancy. Its also possible to develop a vaginal infection even without being sexually active. Tests were performed today to check for bacteria, yeast, gonorrhea, chlamydia, trichomonas, HIV and syphilis. A urine culture has also been sent.  While results are pending, treatment has been initiated for the most common non-STD causes--bacterial vaginosis and yeast infection. It is important that you avoid any sexual activity until your test results have returned, your treatment is complete, and your symptoms have fully resolved. You will only be contacted if any of your test results are positive. You can also review your results through your MyChart account. During this time, avoid douching or using vaginal sprays or deodorants. Wear cotton or cotton-lined underwear to improve airflow and reduce moisture. Be sure to stay hydrated by drinking plenty of fluids. Please note that while you are taking the prescribed Flagyl  (metronidazole ) it is very important to avoid drinking alcohol while on this medication. Drinking alcohol can cause severe side effects, such as nausea, vomiting, and headaches. See your regular doctor or gynecologist if your symptoms do not start to improve with  treatment. Go to the emergency room right away if you develop  a fever, new or worsening pelvic pain, or if the vaginal discharge gets worse.        ED Prescriptions     Medication Sig Dispense Auth. Provider   fluconazole  (DIFLUCAN ) 150 MG tablet Take 1 tablet (150 mg total) by mouth every 3 (three) days for 2 doses. 2 tablet Iola Lukes, FNP   naproxen  (NAPROSYN ) 500 MG tablet Take 1 tablet (500 mg total) by mouth 2 (two) times daily with a meal. Take with food to avoid stomach upset. Do not take any additional NSAIDs while on this. You may take tylenol  in addition to this if needed for extra pain relief. 20 tablet Iola Lukes, FNP      PDMP not reviewed this encounter.     [1]  Social History Tobacco Use   Smoking status: Never   Smokeless tobacco: Never  Vaping Use   Vaping status: Never Used  Substance Use Topics   Alcohol use: Yes    Comment: occ   Drug use: No     Iola Lukes, FNP 08/24/24 1507

## 2024-08-25 LAB — HIV ANTIBODY (ROUTINE TESTING W REFLEX): HIV Screen 4th Generation wRfx: NONREACTIVE

## 2024-08-25 LAB — CERVICOVAGINAL ANCILLARY ONLY
Bacterial Vaginitis (gardnerella): NEGATIVE
Candida Glabrata: NEGATIVE
Candida Vaginitis: NEGATIVE
Chlamydia: NEGATIVE
Comment: NEGATIVE
Comment: NEGATIVE
Comment: NEGATIVE
Comment: NEGATIVE
Comment: NEGATIVE
Comment: NORMAL
Neisseria Gonorrhea: NEGATIVE
Trichomonas: NEGATIVE

## 2024-08-25 LAB — SYPHILIS: RPR W/REFLEX TO RPR TITER AND TREPONEMAL ANTIBODIES, TRADITIONAL SCREENING AND DIAGNOSIS ALGORITHM: RPR Ser Ql: NONREACTIVE

## 2024-10-17 ENCOUNTER — Ambulatory Visit: Payer: Self-pay

## 2024-12-05 ENCOUNTER — Ambulatory Visit: Admitting: Nurse Practitioner
